# Patient Record
Sex: Female | Born: 1955 | Race: Black or African American | Hispanic: No | Marital: Single | Smoking: Never smoker
Health system: Southern US, Community
[De-identification: ages and names within clinical notes are randomized; demographics above are authoritative.]

## PROBLEM LIST (undated history)

## (undated) DIAGNOSIS — G4730 Sleep apnea, unspecified: Secondary | ICD-10-CM

## (undated) DIAGNOSIS — Z8489 Family history of other specified conditions: Secondary | ICD-10-CM

## (undated) DIAGNOSIS — J189 Pneumonia, unspecified organism: Secondary | ICD-10-CM

## (undated) DIAGNOSIS — R079 Chest pain, unspecified: Secondary | ICD-10-CM

## (undated) DIAGNOSIS — I10 Essential (primary) hypertension: Secondary | ICD-10-CM

## (undated) DIAGNOSIS — K219 Gastro-esophageal reflux disease without esophagitis: Secondary | ICD-10-CM

## (undated) DIAGNOSIS — E7800 Pure hypercholesterolemia, unspecified: Secondary | ICD-10-CM

## (undated) DIAGNOSIS — I2699 Other pulmonary embolism without acute cor pulmonale: Secondary | ICD-10-CM

## (undated) HISTORY — PX: COLONOSCOPY: SHX174

## (undated) HISTORY — PX: TONSILLECTOMY: SUR1361

## (undated) HISTORY — DX: Chest pain, unspecified: R07.9

## (undated) HISTORY — PX: ABDOMINAL HYSTERECTOMY: SHX81

---

## 2000-12-06 ENCOUNTER — Encounter: Admission: RE | Admit: 2000-12-06 | Discharge: 2000-12-06 | Payer: Self-pay | Admitting: Internal Medicine

## 2000-12-27 ENCOUNTER — Encounter: Payer: Self-pay | Admitting: Internal Medicine

## 2000-12-27 ENCOUNTER — Ambulatory Visit (HOSPITAL_COMMUNITY): Admission: RE | Admit: 2000-12-27 | Discharge: 2000-12-27 | Payer: Self-pay

## 2002-01-30 ENCOUNTER — Ambulatory Visit (HOSPITAL_COMMUNITY): Admission: RE | Admit: 2002-01-30 | Discharge: 2002-01-30 | Payer: Self-pay | Admitting: Obstetrics & Gynecology

## 2002-01-30 ENCOUNTER — Encounter: Payer: Self-pay | Admitting: Obstetrics & Gynecology

## 2003-03-11 ENCOUNTER — Ambulatory Visit (HOSPITAL_COMMUNITY): Admission: RE | Admit: 2003-03-11 | Discharge: 2003-03-11 | Payer: Self-pay | Admitting: Internal Medicine

## 2003-11-15 ENCOUNTER — Other Ambulatory Visit: Payer: Self-pay

## 2004-02-28 ENCOUNTER — Encounter: Admission: RE | Admit: 2004-02-28 | Discharge: 2004-02-28 | Payer: Self-pay | Admitting: Internal Medicine

## 2004-03-23 ENCOUNTER — Ambulatory Visit (HOSPITAL_COMMUNITY): Admission: RE | Admit: 2004-03-23 | Discharge: 2004-03-23 | Payer: Self-pay | Admitting: Internal Medicine

## 2004-06-04 ENCOUNTER — Ambulatory Visit (HOSPITAL_COMMUNITY): Admission: RE | Admit: 2004-06-04 | Discharge: 2004-06-04 | Payer: Self-pay | Admitting: Obstetrics & Gynecology

## 2004-07-23 ENCOUNTER — Inpatient Hospital Stay (HOSPITAL_COMMUNITY): Admission: RE | Admit: 2004-07-23 | Discharge: 2004-07-24 | Payer: Self-pay | Admitting: Obstetrics & Gynecology

## 2004-07-23 ENCOUNTER — Encounter (INDEPENDENT_AMBULATORY_CARE_PROVIDER_SITE_OTHER): Payer: Self-pay | Admitting: *Deleted

## 2005-04-13 ENCOUNTER — Ambulatory Visit (HOSPITAL_COMMUNITY): Admission: RE | Admit: 2005-04-13 | Discharge: 2005-04-13 | Payer: Self-pay | Admitting: Internal Medicine

## 2005-09-29 ENCOUNTER — Ambulatory Visit: Payer: Self-pay | Admitting: Family Medicine

## 2005-11-16 ENCOUNTER — Ambulatory Visit: Payer: Self-pay | Admitting: Family Medicine

## 2006-02-14 ENCOUNTER — Ambulatory Visit: Payer: Self-pay | Admitting: Family Medicine

## 2006-04-18 ENCOUNTER — Ambulatory Visit (HOSPITAL_COMMUNITY): Admission: RE | Admit: 2006-04-18 | Discharge: 2006-04-18 | Payer: Self-pay | Admitting: Obstetrics & Gynecology

## 2006-05-09 ENCOUNTER — Ambulatory Visit: Payer: Self-pay | Admitting: Family Medicine

## 2006-08-02 ENCOUNTER — Ambulatory Visit: Payer: Self-pay | Admitting: Family Medicine

## 2007-01-31 DIAGNOSIS — E785 Hyperlipidemia, unspecified: Secondary | ICD-10-CM | POA: Insufficient documentation

## 2007-01-31 DIAGNOSIS — E782 Mixed hyperlipidemia: Secondary | ICD-10-CM | POA: Insufficient documentation

## 2007-01-31 DIAGNOSIS — I10 Essential (primary) hypertension: Secondary | ICD-10-CM | POA: Insufficient documentation

## 2007-06-08 ENCOUNTER — Ambulatory Visit (HOSPITAL_COMMUNITY): Admission: RE | Admit: 2007-06-08 | Discharge: 2007-06-08 | Payer: Self-pay | Admitting: Obstetrics & Gynecology

## 2007-09-15 ENCOUNTER — Telehealth (INDEPENDENT_AMBULATORY_CARE_PROVIDER_SITE_OTHER): Payer: Self-pay | Admitting: Internal Medicine

## 2008-01-06 ENCOUNTER — Emergency Department (HOSPITAL_COMMUNITY): Admission: EM | Admit: 2008-01-06 | Discharge: 2008-01-06 | Payer: Self-pay | Admitting: Emergency Medicine

## 2008-06-11 ENCOUNTER — Ambulatory Visit (HOSPITAL_COMMUNITY): Admission: RE | Admit: 2008-06-11 | Discharge: 2008-06-11 | Payer: Self-pay | Admitting: Family Medicine

## 2008-06-27 ENCOUNTER — Other Ambulatory Visit: Admission: RE | Admit: 2008-06-27 | Discharge: 2008-06-27 | Payer: Self-pay | Admitting: Family Medicine

## 2009-07-03 ENCOUNTER — Ambulatory Visit (HOSPITAL_COMMUNITY): Admission: RE | Admit: 2009-07-03 | Discharge: 2009-07-03 | Payer: Self-pay | Admitting: Family Medicine

## 2009-07-09 ENCOUNTER — Other Ambulatory Visit: Admission: RE | Admit: 2009-07-09 | Discharge: 2009-07-09 | Payer: Self-pay | Admitting: Family Medicine

## 2010-05-02 ENCOUNTER — Encounter: Payer: Self-pay | Admitting: Obstetrics & Gynecology

## 2010-05-02 ENCOUNTER — Encounter: Payer: Self-pay | Admitting: Internal Medicine

## 2010-05-03 ENCOUNTER — Encounter: Payer: Self-pay | Admitting: Obstetrics & Gynecology

## 2010-05-26 ENCOUNTER — Other Ambulatory Visit (HOSPITAL_COMMUNITY): Payer: Self-pay | Admitting: Family Medicine

## 2010-05-26 DIAGNOSIS — Z1231 Encounter for screening mammogram for malignant neoplasm of breast: Secondary | ICD-10-CM

## 2010-07-06 ENCOUNTER — Ambulatory Visit (HOSPITAL_COMMUNITY)
Admission: RE | Admit: 2010-07-06 | Discharge: 2010-07-06 | Disposition: A | Discharge status: Home / Self Care | Payer: BC Managed Care – PPO | Source: Ambulatory Visit | Attending: Family Medicine | Admitting: Family Medicine

## 2010-07-06 DIAGNOSIS — Z1231 Encounter for screening mammogram for malignant neoplasm of breast: Secondary | ICD-10-CM | POA: Insufficient documentation

## 2010-07-21 ENCOUNTER — Other Ambulatory Visit: Payer: Self-pay | Admitting: Family Medicine

## 2010-07-21 ENCOUNTER — Other Ambulatory Visit (HOSPITAL_COMMUNITY)
Admission: RE | Admit: 2010-07-21 | Discharge: 2010-07-21 | Disposition: A | Discharge status: Home / Self Care | Payer: BC Managed Care – PPO | Source: Ambulatory Visit | Attending: Family Medicine | Admitting: Family Medicine

## 2010-07-21 DIAGNOSIS — Z124 Encounter for screening for malignant neoplasm of cervix: Secondary | ICD-10-CM | POA: Insufficient documentation

## 2010-08-28 NOTE — Op Note (Signed)
NAME:  Renee Bennett, Renee Bennett           ACCOUNT NO.:  [id number]   MEDICAL RECORD NO.:  [id number]          PATIENT TYPE:  INP   LOCATION:  9302                          FACILITY:  [facility]   PHYSICIAN:  Roseanna Rainbow, M.D.DATE OF BIRTH:  [birth year]   DATE OF PROCEDURE:  07/23/2004  DATE OF DISCHARGE:                                 OPERATIVE REPORT   PREOPERATIVE DIAGNOSES:  Uterine fibroids, menorrhagia refractory to  attempts at medical management, rule out endometrial polyp.   POSTOPERATIVE DIAGNOSES:  Uterine fibroids, menorrhagia refractory to  attempts at medical management, rule out endometrial polyp.   PROCEDURE:  Total vaginal hysterectomy with morcellation using a coring  technique.   SURGEON:  Roseanna Rainbow, M.D.   ASSISTANT:  Coral Ceo, M.D.   ANESTHESIA:  General endotracheal.   COMPLICATIONS:  None.   ESTIMATED BLOOD LOSS:  100 mL.   URINE OUTPUT:  100 mL clear urine at the end of the procedure.   FLUIDS:  As per anesthesiology.   FINDINGS:  Examination under anesthesia demonstrated a mildly enlarged  anteverted uterus approximately 12-14 weeks aggregate size. Intraoperatively  there were multiple myomas noted, normal tubes and ovaries.   DESCRIPTION OF PROCEDURE:  The risks, benefits, indications and alternatives  of the procedure were reviewed with the patient and informed consent was  obtained. The patient was taken to the operating room with an IV running.  The patient was placed in the dorsal lithotomy position and prepped and  draped in the usual sterile fashion. A weighted speculum was placed into the  vagina. The cervix was grasped with a Christella Hartigan tenaculum. The cervix was then  injected anteriorly with 1% lidocaine with 1:200,000 of epinephrine. The  anterior vaginal mucosa was then incised with the scalpel and the bladder  dissected off the pubovesical cervical fascia anteriorly with Metzenbaum  scissors. The anterior cul-de-sac was  entered sharply. The same procedure  was performed posteriorly and the posterior cul-de-sac entered sharply  without difficulty. At this point, parametrial clamps were then placed over  the uterosacral ligaments on either side. These were then transected and  suture ligated with #0 Vicryl. Hemostasis was assured. The cardinal  ligaments were then clamped on both sides, transected and suture ligated in  a similar fashion. The uterine arteries and broad ligaments were then  serially clamped with parametrial clamps, transected and suture ligated on  both sides. Excellent hemostasis was visualized. At this point, a coring  technique was then performed to morcellate the uterus. Both cornu were then  clamped with parametrial clamps, transected and the remainder of the uterus  delivered. These pedicles were both secured with free ligatures and suture  ligatures.  Adequate hemostasis was noted.   The vaginal cuff angles were closed with figure-of-eight sutures of #0  Vicryl on both sides. The peritoneum was closed with a pursestring suture of  #0 Vicryl. The remainder of the vaginal cuff was closed with figure-of-eight  sutures of #0 Vicryl in an interrupted fashion. All instruments were then  removed from the vagina. The patient was taken out of the dorsal lithotomy  position and awakened  from general anesthesia. The patient was taken to the  PACU in stable condition. Sponge, lap, needle and instrument counts were  correct x2.   PATHOLOGY:  Uterus and cervix.      LAJ/MEDQ  D:  07/23/2004  T:  07/23/2004  Job:  [id number]

## 2010-08-28 NOTE — H&P (Signed)
NAME:  Renee Bennett, Renee Bennett NO.:  [id number]   MEDICAL RECORD NO.:  [id number]            PATIENT TYPE:   LOCATION:                                 FACILITY:   PHYSICIAN:  Roseanna Rainbow, M.D. DATE OF BIRTH:   DATE OF ADMISSION:  DATE OF DISCHARGE:                                HISTORY & PHYSICAL   CHIEF COMPLAINT:  The patient is a 55 year old African-American female with  a small to moderately enlarged myomatous uterus with secondary menorrhagia  refractory to attempts at medical management who presents for a total  vaginal hysterectomy.   HISTORY OF PRESENT ILLNESS:  Please see the above.  Work-up to date has  included a pelvic ultrasound from November of 2005 that demonstrated a  uterus with a sagittal diameter of 13 cm with multiple myomas; a possible  submucous myoma versus a small polyp.  A sonohystogram demonstrated multiple  myomas, as well as a 2 cm sessile endometrial polyp in the fundus of the  endometrial cavity.  A hemoglobin from August of 2005 was 11.2.  A Pap smear  from November of 2005 was within normal limits.   PAST OBSTETRIC AND GYNECOLOGIC HISTORY:  She is status post a bilateral  tubal ligation.  She has been pregnant 2 times.  She has 2 living children.   PAST MEDICAL HISTORY:  1.  Remarkable for chronic hypertension.  2.  Hyperlipidemia.   FAMILY HISTORY:  Remarkable for heart disease, diabetes, and hypertension.   SOCIAL HISTORY:  She is married.  A nonsmoker.  Consumes caffeine.   ALLERGIES:  No known drug allergies.   REVIEW OF SYSTEMS:  Noncontributory.   PAST SURGICAL HISTORY:  See the above.   PHYSICAL EXAMINATION:  VITAL SIGNS:  Stable, afebrile.  Blood pressure  140/72.  GENERAL/CONSTITUTIONAL:  African-American female who appears greater than  stated age.  Moderately overweight body habitus.  In no acute distress.  LUNGS:  Clear to auscultation bilaterally.  HEART:  Regular rate and rhythm.  ABDOMEN:  Soft,  nontender, without masses.  PELVIC:  Normal EGBUS.  Speculum exam of the vagina is clean.  The cervix is  without lesions or discharge.  Bimanual exam - the uterus is anteverted,  nontender.  Normal mobility.  Mildly enlarged, approximately 12 weeks  aggregate size.  Adnexa - no masses, organomegaly, or local guarding.  EXTREMITIES:  No clubbing, cyanosis, or edema.  SKIN:  Without rash.   ASSESSMENT:  Leiomyomatous uterus, mildly enlarged.  Also rule out  endometrial polyp with menorrhagia, refractory to attempts at medical  management.   PLAN:  The planned procedure is a total vaginal hysterectomy.  I discussed  the risks, benefits, and alternatives of surgery at length.      LAJ/MEDQ  D:  07/22/2004  T:  07/22/2004  Job:  [id number]

## 2011-06-03 ENCOUNTER — Other Ambulatory Visit (HOSPITAL_COMMUNITY): Payer: Self-pay | Admitting: Family Medicine

## 2011-06-03 DIAGNOSIS — Z1231 Encounter for screening mammogram for malignant neoplasm of breast: Secondary | ICD-10-CM

## 2011-07-08 ENCOUNTER — Ambulatory Visit (HOSPITAL_COMMUNITY): Payer: BC Managed Care – PPO

## 2011-07-09 ENCOUNTER — Ambulatory Visit (HOSPITAL_COMMUNITY): Payer: BC Managed Care – PPO

## 2011-07-09 ENCOUNTER — Ambulatory Visit (HOSPITAL_COMMUNITY)
Admission: RE | Admit: 2011-07-09 | Discharge: 2011-07-09 | Disposition: A | Discharge status: Home / Self Care | Payer: BC Managed Care – PPO | Source: Ambulatory Visit | Attending: Family Medicine | Admitting: Family Medicine

## 2011-07-09 DIAGNOSIS — Z1231 Encounter for screening mammogram for malignant neoplasm of breast: Secondary | ICD-10-CM | POA: Insufficient documentation

## 2012-06-07 ENCOUNTER — Other Ambulatory Visit (HOSPITAL_COMMUNITY): Payer: Self-pay | Admitting: Family Medicine

## 2012-07-10 ENCOUNTER — Ambulatory Visit (HOSPITAL_COMMUNITY)
Admission: RE | Admit: 2012-07-10 | Discharge: 2012-07-10 | Disposition: A | Discharge status: Home / Self Care | Payer: BC Managed Care – PPO | Source: Ambulatory Visit | Attending: Family Medicine | Admitting: Family Medicine

## 2012-07-10 DIAGNOSIS — Z1231 Encounter for screening mammogram for malignant neoplasm of breast: Secondary | ICD-10-CM

## 2013-05-02 ENCOUNTER — Emergency Department (HOSPITAL_COMMUNITY): Payer: BC Managed Care – PPO

## 2013-05-02 ENCOUNTER — Observation Stay (HOSPITAL_COMMUNITY)
Admission: EM | Admit: 2013-05-02 | Discharge: 2013-05-03 | Disposition: A | Discharge status: Home / Self Care | Payer: BC Managed Care – PPO | Attending: Internal Medicine | Admitting: Internal Medicine

## 2013-05-02 ENCOUNTER — Encounter (HOSPITAL_COMMUNITY): Payer: Self-pay | Admitting: Emergency Medicine

## 2013-05-02 DIAGNOSIS — R079 Chest pain, unspecified: Secondary | ICD-10-CM | POA: Diagnosis present

## 2013-05-02 DIAGNOSIS — E782 Mixed hyperlipidemia: Secondary | ICD-10-CM | POA: Diagnosis present

## 2013-05-02 DIAGNOSIS — I309 Acute pericarditis, unspecified: Secondary | ICD-10-CM

## 2013-05-02 DIAGNOSIS — IMO0002 Reserved for concepts with insufficient information to code with codable children: Secondary | ICD-10-CM | POA: Insufficient documentation

## 2013-05-02 DIAGNOSIS — Z79899 Other long term (current) drug therapy: Secondary | ICD-10-CM | POA: Insufficient documentation

## 2013-05-02 DIAGNOSIS — R000 Tachycardia, unspecified: Secondary | ICD-10-CM | POA: Insufficient documentation

## 2013-05-02 DIAGNOSIS — E785 Hyperlipidemia, unspecified: Secondary | ICD-10-CM | POA: Diagnosis present

## 2013-05-02 DIAGNOSIS — I10 Essential (primary) hypertension: Secondary | ICD-10-CM | POA: Insufficient documentation

## 2013-05-02 DIAGNOSIS — R0602 Shortness of breath: Secondary | ICD-10-CM | POA: Insufficient documentation

## 2013-05-02 DIAGNOSIS — E7800 Pure hypercholesterolemia, unspecified: Secondary | ICD-10-CM | POA: Insufficient documentation

## 2013-05-02 DIAGNOSIS — R0789 Other chest pain: Principal | ICD-10-CM | POA: Insufficient documentation

## 2013-05-02 DIAGNOSIS — Z8709 Personal history of other diseases of the respiratory system: Secondary | ICD-10-CM | POA: Insufficient documentation

## 2013-05-02 DIAGNOSIS — E669 Obesity, unspecified: Secondary | ICD-10-CM | POA: Insufficient documentation

## 2013-05-02 HISTORY — DX: Essential (primary) hypertension: I10

## 2013-05-02 HISTORY — DX: Pure hypercholesterolemia, unspecified: E78.00

## 2013-05-02 LAB — BASIC METABOLIC PANEL
BUN: 12 mg/dL (ref 6–23)
CALCIUM: 9.5 mg/dL (ref 8.4–10.5)
CHLORIDE: 100 meq/L (ref 96–112)
CO2: 23 meq/L (ref 19–32)
Creatinine, Ser: 0.57 mg/dL (ref 0.50–1.10)
GFR calc non Af Amer: >90 mL/min (ref >90)
Glucose, Bld: 152 mg/dL — ABNORMAL HIGH (ref 70–99)
Potassium: 4.3 mEq/L (ref 3.7–5.3)
Sodium: 138 mEq/L (ref 137–147)

## 2013-05-02 LAB — PRO B NATRIURETIC PEPTIDE: PRO B NATRI PEPTIDE: 25.5 pg/mL (ref 0–125)

## 2013-05-02 LAB — CBC
HCT: 39.4 % (ref 36.0–46.0)
Hemoglobin: 13.2 g/dL (ref 12.0–15.0)
MCH: 27.8 pg (ref 26.0–34.0)
MCHC: 33.5 g/dL (ref 30.0–36.0)
MCV: 83.1 fL (ref 78.0–100.0)
Platelets: 231 10*3/uL (ref 150–400)
RBC: 4.74 MIL/uL (ref 3.87–5.11)
RDW: 15.1 % (ref 11.5–15.5)
WBC: 17 10*3/uL — ABNORMAL HIGH (ref 4.0–10.5)

## 2013-05-02 LAB — CBC WITH DIFFERENTIAL/PLATELET
BASOS ABS: 0 10*3/uL (ref 0.0–0.1)
Basophils Relative: 0 % (ref 0–1)
EOS ABS: 0 10*3/uL (ref 0.0–0.7)
Eosinophils Relative: 0 % (ref 0–5)
HCT: 41.3 % (ref 36.0–46.0)
Hemoglobin: 13.9 g/dL (ref 12.0–15.0)
LYMPHS ABS: 1.5 10*3/uL (ref 0.7–4.0)
Lymphocytes Relative: 9 % — ABNORMAL LOW (ref 12–46)
MCH: 27.9 pg (ref 26.0–34.0)
MCHC: 33.7 g/dL (ref 30.0–36.0)
MCV: 82.9 fL (ref 78.0–100.0)
MONO ABS: 1 10*3/uL (ref 0.1–1.0)
Monocytes Relative: 7 % (ref 3–12)
NEUTROS ABS: 12.9 10*3/uL — AB (ref 1.7–7.7)
NEUTROS PCT: 84 % — AB (ref 43–77)
Platelets: 245 10*3/uL (ref 150–400)
RBC: 4.98 MIL/uL (ref 3.87–5.11)
RDW: 15.2 % (ref 11.5–15.5)
WBC: 15.5 10*3/uL — AB (ref 4.0–10.5)

## 2013-05-02 LAB — TROPONIN I

## 2013-05-02 LAB — CREATININE, SERUM
CREATININE: 0.72 mg/dL (ref 0.50–1.10)
GFR calc Af Amer: >90 mL/min (ref >90)
GFR calc non Af Amer: >90 mL/min (ref >90)

## 2013-05-02 LAB — D-DIMER, QUANTITATIVE: D-Dimer, Quant: 0.32 ug/mL-FEU (ref 0.00–0.48)

## 2013-05-02 MED ORDER — ACETAMINOPHEN 325 MG PO TABS: 650.0000 mg | ORAL_TABLET | ORAL | Status: DC | PRN

## 2013-05-02 MED ORDER — GI COCKTAIL ~~LOC~~: 30.0000 mL | Freq: Four times a day (QID) | ORAL | Status: DC | PRN

## 2013-05-02 MED ORDER — NITROGLYCERIN 2 % TD OINT
1.0000 [in_us] | TOPICAL_OINTMENT | Freq: Once | TRANSDERMAL | Status: AC
  Administered 2013-05-02: 1 [in_us] via TOPICAL
  Filled 2013-05-02: qty 1

## 2013-05-02 MED ORDER — PANTOPRAZOLE SODIUM 40 MG PO TBEC
40.0000 mg | DELAYED_RELEASE_TABLET | Freq: Every day | ORAL | Status: DC
  Administered 2013-05-02 – 2013-05-03 (×2): 40 mg via ORAL
  Filled 2013-05-02 (×2): qty 1

## 2013-05-02 MED ORDER — MORPHINE SULFATE 4 MG/ML IJ SOLN
4.0000 mg | Freq: Once | INTRAMUSCULAR | Status: AC
  Administered 2013-05-02: 4 mg via INTRAVENOUS
  Filled 2013-05-02: qty 1

## 2013-05-02 MED ORDER — BENAZEPRIL-HYDROCHLOROTHIAZIDE 20-12.5 MG PO TABS: 1.0000 | ORAL_TABLET | Freq: Every day | ORAL | Status: DC

## 2013-05-02 MED ORDER — MULTIVITAL PO TABS: 1.0000 | ORAL_TABLET | Freq: Every day | ORAL | Status: DC

## 2013-05-02 MED ORDER — ALPRAZOLAM 0.25 MG PO TABS: 0.2500 mg | ORAL_TABLET | Freq: Two times a day (BID) | ORAL | Status: DC | PRN

## 2013-05-02 MED ORDER — GI COCKTAIL ~~LOC~~
30.0000 mL | Freq: Once | ORAL | Status: AC
  Administered 2013-05-02: 30 mL via ORAL
  Filled 2013-05-02: qty 30

## 2013-05-02 MED ORDER — SIMVASTATIN 40 MG PO TABS
40.0000 mg | ORAL_TABLET | Freq: Every day | ORAL | Status: DC
  Administered 2013-05-03: 40 mg via ORAL
  Filled 2013-05-02: qty 1

## 2013-05-02 MED ORDER — ASPIRIN 81 MG PO CHEW
324.0000 mg | CHEWABLE_TABLET | Freq: Once | ORAL | Status: AC
  Administered 2013-05-02: 324 mg via ORAL
  Filled 2013-05-02: qty 4

## 2013-05-02 MED ORDER — SIMETHICONE 80 MG PO CHEW
80.0000 mg | CHEWABLE_TABLET | Freq: Four times a day (QID) | ORAL | Status: DC | PRN
  Filled 2013-05-02: qty 1

## 2013-05-02 MED ORDER — ONDANSETRON HCL 4 MG/2ML IJ SOLN: 4.0000 mg | Freq: Four times a day (QID) | INTRAMUSCULAR | Status: DC | PRN

## 2013-05-02 MED ORDER — FLUTICASONE PROPIONATE 50 MCG/ACT NA SUSP
1.0000 | Freq: Two times a day (BID) | NASAL | Status: DC
  Administered 2013-05-02 – 2013-05-03 (×2): 1 via NASAL
  Filled 2013-05-02: qty 16

## 2013-05-02 MED ORDER — SODIUM CHLORIDE 0.9 % IV SOLN: INTRAVENOUS | Status: DC

## 2013-05-02 MED ORDER — BENAZEPRIL HCL 20 MG PO TABS
20.0000 mg | ORAL_TABLET | Freq: Every day | ORAL | Status: DC
  Administered 2013-05-03: 20 mg via ORAL
  Filled 2013-05-02: qty 1

## 2013-05-02 MED ORDER — HYDROCHLOROTHIAZIDE 12.5 MG PO CAPS
12.5000 mg | ORAL_CAPSULE | Freq: Every day | ORAL | Status: DC
  Administered 2013-05-03: 12.5 mg via ORAL
  Filled 2013-05-02: qty 1

## 2013-05-02 MED ORDER — AZELASTINE-FLUTICASONE 137-50 MCG/ACT NA SUSP: 1.0000 | Freq: Two times a day (BID) | NASAL | Status: DC

## 2013-05-02 MED ORDER — ASPIRIN EC 325 MG PO TBEC
325.0000 mg | DELAYED_RELEASE_TABLET | Freq: Every day | ORAL | Status: DC
  Administered 2013-05-03: 325 mg via ORAL
  Filled 2013-05-02: qty 1

## 2013-05-02 MED ORDER — ENOXAPARIN SODIUM 40 MG/0.4ML ~~LOC~~ SOLN
40.0000 mg | SUBCUTANEOUS | Status: DC
  Administered 2013-05-02: 40 mg via SUBCUTANEOUS
  Filled 2013-05-02 (×2): qty 0.4

## 2013-05-02 MED ORDER — MORPHINE SULFATE 2 MG/ML IJ SOLN: 2.0000 mg | INTRAMUSCULAR | Status: DC | PRN

## 2013-05-02 MED ORDER — ADULT MULTIVITAMIN W/MINERALS CH
1.0000 | ORAL_TABLET | Freq: Every day | ORAL | Status: DC
Start: 2013-05-02 — End: 2013-05-03
  Administered 2013-05-02 – 2013-05-03 (×2): 1 via ORAL
  Filled 2013-05-02 (×2): qty 1

## 2013-05-02 NOTE — ED Notes (Signed)
Patient states started having central chest pain (tightness) last night.   Patient points to her throat instead of chest when describing the "chest pain".   Patient states her throat hurts also.

## 2013-05-02 NOTE — ED Provider Notes (Signed)
CSN: [id number]     Arrival date & time 05/02/13  1127 History   First MD Initiated Contact with Patient 05/02/13 1151     Chief Complaint  Patient presents with  . Chest Pain  . Shortness of Breath   (Consider location/radiation/quality/duration/timing/severity/associated sxs/prior Treatment) HPI  This a 58 year old female with a history of hypertension, hyperlipidemia who presents for chest pain. Patient reports onset of symptoms last night. She reports the pain as a "tightness." It is worse with breathing. She denies any dyspnea on exertion. Currently her pain is rated at 2/10. She denies any leg swelling, history of blood clots, recent surgery, or wrist factors for clots. She does have early family history of heart disease. Patient has not taken aspirin today.  Past Medical History  Diagnosis Date  . Hypertension   . Hypercholesterolemia    Past Surgical History  Procedure Laterality Date  . Abdominal hysterectomy    . Tonsillectomy     No family history on file. History  Substance Use Topics  . Smoking status: Never Smoker   . Smokeless tobacco: Not on file  . Alcohol Use: No   OB History   Grav Para Term Preterm Abortions TAB SAB Ect Mult Living                 Review of Systems  Constitutional: Negative for fever.  Respiratory: Positive for chest tightness and shortness of breath. Negative for cough.   Cardiovascular: Positive for chest pain. Negative for leg swelling.  Gastrointestinal: Negative for nausea, vomiting and abdominal pain.  Genitourinary: Negative for dysuria.  Skin: Negative for rash.  Neurological: Negative for headaches.  Psychiatric/Behavioral: Negative for confusion.  All other systems reviewed and are negative.    Allergies  Review of patient's allergies indicates no known allergies.  Home Medications   Current Outpatient Rx  Name  Route  Sig  Dispense  Refill  . Azelastine-Fluticasone (DYMISTA) 137-50 MCG/ACT SUSP   Nasal   Place 1  spray into the nose 2 (two) times daily.         . benazepril-hydrochlorthiazide (LOTENSIN HCT) 20-12.5 MG per tablet   Oral   Take 1 tablet by mouth daily.         . Multiple Vitamins-Minerals (MULTIVITAL) tablet   Oral   Take 1 tablet by mouth daily.         . Simethicone (GAS-X PO)   Oral   Take 1 tablet by mouth daily.         . simvastatin (ZOCOR) 40 MG tablet   Oral   Take 40 mg by mouth daily.          BP 130/77  Pulse 112  Temp(Src) 98.8 F (37.1 C) (Oral)  Resp 30  Ht 5\' 6"  (1.676 m)  Wt 236 lb (107.049 kg)  BMI 38.11 kg/m2  SpO2 95% Physical Exam  Nursing note and vitals reviewed. Constitutional: She is oriented to person, place, and time. No distress.  Obese  HENT:  Head: Normocephalic and atraumatic.  Mouth/Throat: Oropharynx is clear and moist.  Eyes: Pupils are equal, round, and reactive to light.  Neck: Neck supple.  Cardiovascular: Normal rate, regular rhythm and normal heart sounds.   No murmur heard. Pulmonary/Chest: Effort normal and breath sounds normal. No respiratory distress. She has no wheezes.  Abdominal: Soft. Bowel sounds are normal.  Musculoskeletal: She exhibits no edema.  Neurological: She is alert and oriented to person, place, and time.  Skin: Skin is warm  and dry.  Psychiatric: She has a normal mood and affect.    ED Course  Procedures (including critical care time) Labs Review Labs Reviewed  CBC WITH DIFFERENTIAL - Abnormal; Notable for the following:    WBC 15.5 (*)    Neutrophils Relative % 84 (*)    Neutro Abs 12.9 (*)    Lymphocytes Relative 9 (*)    All other components within normal limits  BASIC METABOLIC PANEL - Abnormal; Notable for the following:    Glucose, Bld 152 (*)    All other components within normal limits  TROPONIN I  PRO B NATRIURETIC PEPTIDE  D-DIMER, QUANTITATIVE   Imaging Review Dg Chest 2 View  05/02/2013   CLINICAL DATA:  Chest pain and shortness of breath.  EXAM: CHEST  2 VIEW   COMPARISON:  None.  FINDINGS: Minimal linear atelectasis is seen in the left lung base. There is mild elevation of the right hemidiaphragm. Right lung is clear. Heart size is normal. No pneumothorax or pleural effusion.  IMPRESSION: No acute disease.   Electronically Signed   By: [name]  Dalessio M.D.   On: 05/02/2013 13:09    EKG Interpretation    Date/Time:  Wednesday May 02 2013 11:34:01 EST Ventricular Rate:  128 PR Interval:  148 QRS Duration: 86 QT Interval:  314 QTC Calculation: 458 R Axis:   31 Text Interpretation:  Sinus tachycardia Possible Left atrial enlargement Borderline ECG NO prior for comparison Confirmed by Rohini Jaroszewski  MD, Marchello Rothgeb ([id number]) on 05/02/2013 11:54:11 AM            MDM   1. Chest pain    Patient presents with chest pain. Currently 10 out of 10. Noted to be tachycardic on exam to 127. No history of blood clots or leg swelling. Primary is negative. Patient does have risk factors for ACS including hypertension, hyperlipidemia, and early family history. EKG notable for tachycardia. Chest x-ray and troponin are reassuring. Patient was given nitro paste and aspirin. She continues to report a "twinge of pain." On reexamination. Patient was given morphine and a GI cocktail. She had a recent history of sore throat and was told that she might have GERD. Heart score is 4. Given the pain and risk factors, will limit for serial cardiac enzymes.    Shon Batonourtney F Masiah Lewing, MD 05/02/13 [phone]

## 2013-05-02 NOTE — H&P (Signed)
PATIENT DETAILS Name: DARNESHIA DEMARY Age: 58 y.o. Sex: female Date of Birth: [birth year] Admit Date: 05/02/2013 PCP:Amy Ermalene Searing, MD   CHIEF COMPLAINT:  Chest pain since this morning  HPI: ARIZA EVANS is a 58 y.o. female with a Past Medical History of hypertension and dyslipidemia who presents today with the above noted complaint. Per patient, she woke up early this morning around 2:30 with retrosternal chest pain. She describes the chest pain as tightness, made worse by breathing. No associated nausea, vomiting or diaphoresis. Claims to have had associated shortness of breath. Earlier this morning, she rates her pain around 6/10 in severity. She claims that she feels at times the pain in her neck as well. She was unable to sleep because of the chest discomfort. She had a previously scheduled appointment with ENT, following the appointment because she was still having some mild persistent chest pain she came to the emergency room for further evaluation and treatment. In the emergency room, initial cardiac enzymes and troponins were negative. D-dimer was also within normal limits. I was subsequently asked to admit this patient for further evaluation and treatment.   ALLERGIES:  No Known Allergies  PAST MEDICAL HISTORY: Past Medical History  Diagnosis Date  . Hypertension   . Hypercholesterolemia     PAST SURGICAL HISTORY: Past Surgical History  Procedure Laterality Date  . Abdominal hysterectomy    . Tonsillectomy      MEDICATIONS AT HOME: Prior to Admission medications   Medication Sig Start Date End Date Taking? Authorizing Provider  Azelastine-Fluticasone (DYMISTA) 137-50 MCG/ACT SUSP Place 1 spray into the nose 2 (two) times daily.   Yes Historical Provider, MD  benazepril-hydrochlorthiazide (LOTENSIN HCT) 20-12.5 MG per tablet Take 1 tablet by mouth daily.   Yes Historical Provider, MD  Multiple Vitamins-Minerals (MULTIVITAL) tablet Take 1 tablet by mouth  daily.   Yes Historical Provider, MD  Simethicone (GAS-X PO) Take 1 tablet by mouth daily.   Yes Historical Provider, MD  simvastatin (ZOCOR) 40 MG tablet Take 40 mg by mouth daily.   Yes Historical Provider, MD    FAMILY HISTORY:  Coronary artery disease in sister at 66  SOCIAL HISTORY:  reports that she has never smoked. She does not have any smokeless tobacco history on file. She reports that she does not drink alcohol or use illicit drugs.  REVIEW OF SYSTEMS:  Constitutional:   No  weight loss, night sweats,  Fevers, chills, fatigue.  HEENT:    No headaches, Difficulty swallowing,Tooth/dental problems,Sore throat,  No sneezing, itching, ear ache, nasal congestion, post nasal drip,   Cardio-vascular: No chest pain,  Orthopnea, PND, swelling in lower extremities, anasarca,         dizziness, palpitations  GI:  No heartburn, indigestion, abdominal pain, nausea, vomiting, diarrhea, change in       bowel habits, loss of appetite  Resp: No shortness of breath with exertion or at rest.  No excess mucus, no productive cough, No non-productive cough,  No coughing up of blood.No change in color of mucus.No wheezing.No chest wall deformity  Skin:  no rash or lesions.  GU:  no dysuria, change in color of urine, no urgency or frequency.  No flank pain.  Musculoskeletal: No joint pain or swelling.  No decreased range of motion.  No back pain.  Psych: No change in mood or affect. No depression or anxiety.  No memory loss.   PHYSICAL EXAM: Blood pressure 100/57, pulse 99, temperature 98.8 F (37.1 C), temperature  source Oral, resp. rate 22, height 5\' 6"  (1.676 m), weight 107.049 kg (236 lb), SpO2 96.00%.  General appearance :Awake, alert, not in any distress. Speech Clear. Not toxic Looking HEENT: Atraumatic and Normocephalic, pupils equally reactive to light and accomodation Neck: supple, no JVD. No cervical lymphadenopathy.  Chest:Good air entry bilaterally, no added sounds   CVS: S1 S2 regular, no murmurs.  Abdomen: Bowel sounds present, Non tender and not distended with no gaurding, rigidity or rebound. Extremities: B/L Lower Ext shows no edema, both legs are warm to touch Neurology: Awake alert, and oriented X 3, CN II-XII intact, Non focal Skin:No Rash Wounds:N/A  LABS ON ADMISSION:   Recent Labs  05/02/13 1220  NA 138  K 4.3  CL 100  CO2 23  GLUCOSE 152*  BUN 12  CREATININE 0.57  CALCIUM 9.5   No results found for this basename: AST, ALT, ALKPHOS, BILITOT, PROT, ALBUMIN,  in the last 72 hours No results found for this basename: LIPASE, AMYLASE,  in the last 72 hours  Recent Labs  05/02/13 1220  WBC 15.5*  NEUTROABS 12.9*  HGB 13.9  HCT 41.3  MCV 82.9  PLT 245    Recent Labs  05/02/13 1220  TROPONINI <0.30    Recent Labs  05/02/13 1220  DDIMER 0.32   No components found with this basename: POCBNP,    RADIOLOGIC STUDIES ON ADMISSION: Dg Chest 2 View  05/02/2013   CLINICAL DATA:  Chest pain and shortness of breath.  EXAM: CHEST  2 VIEW  COMPARISON:  None.  FINDINGS: Minimal linear atelectasis is seen in the left lung base. There is mild elevation of the right hemidiaphragm. Right lung is clear. Heart size is normal. No pneumothorax or pleural effusion.  IMPRESSION: No acute disease.   Electronically Signed   By: [name]  Dalessio M.D.   On: 05/02/2013 13:09     EKG: Independently reviewed.  Normal sinus rhythm  ASSESSMENT AND PLAN: Present on Admission:  . Chest pain - With both typical and atypical features. Currently chest pain-free, initial cardiac enzymes, EKG and d-dimer negative.  - Continue aspirin, we'll admit as observation in telemetry and keep n.p.o. post midnight for possible stress testing. -Have notified cardiology of the patient's admission, and they will see him in a.m.  - Cycle cardiac enzymes, if troponins do turn positive we'll consult cardiology   . HYPERLIPIDEMIA - Continue statins   .  HYPERTENSION - Continue preadmission antihypertensive medications   Further plan will depend as patient's clinical course evolves and further radiologic and laboratory data become available. Patient will be monitored closely.  Above noted plan was discussed with patient/son, they were in agreement.   DVT Prophylaxis: Prophylactic Lovenox  Code Status: Full Code  Total time spent for admission equals 45 minutes.  [facility] Surgery Center At Clifton RoadGHIMIRE,Macey Wurtz [facility] Pager [phone]  If 7PM-7AM, please contact night-coverage [email or web address] Password [email or web address] 05/02/2013, 3:57 PM

## 2013-05-03 ENCOUNTER — Observation Stay (HOSPITAL_COMMUNITY): Payer: BC Managed Care – PPO

## 2013-05-03 DIAGNOSIS — I309 Acute pericarditis, unspecified: Secondary | ICD-10-CM

## 2013-05-03 DIAGNOSIS — R079 Chest pain, unspecified: Secondary | ICD-10-CM

## 2013-05-03 DIAGNOSIS — I517 Cardiomegaly: Secondary | ICD-10-CM

## 2013-05-03 LAB — URINALYSIS, ROUTINE W REFLEX MICROSCOPIC
Bilirubin Urine: NEGATIVE
GLUCOSE, UA: NEGATIVE mg/dL
Hgb urine dipstick: NEGATIVE
Ketones, ur: NEGATIVE mg/dL
Leukocytes, UA: NEGATIVE
Nitrite: NEGATIVE
PH: 5.5 (ref 5.0–8.0)
Protein, ur: NEGATIVE mg/dL
Specific Gravity, Urine: 1.02 (ref 1.005–1.030)
Urobilinogen, UA: 1 mg/dL (ref 0.0–1.0)

## 2013-05-03 MED ORDER — AMOXICILLIN-POT CLAVULANATE 875-125 MG PO TABS
1.0000 | ORAL_TABLET | Freq: Two times a day (BID) | ORAL | Status: DC
  Administered 2013-05-03: 1 via ORAL
  Filled 2013-05-03 (×2): qty 1

## 2013-05-03 MED ORDER — PANTOPRAZOLE SODIUM 40 MG PO TBEC: 40.0000 mg | DELAYED_RELEASE_TABLET | Freq: Every day | ORAL | Status: DC

## 2013-05-03 MED ORDER — TECHNETIUM TC 99M SESTAMIBI GENERIC - CARDIOLITE
30.0000 | Freq: Once | INTRAVENOUS | Status: AC | PRN
  Administered 2013-05-03: 30 via INTRAVENOUS

## 2013-05-03 MED ORDER — IBUPROFEN 400 MG PO TABS: 400.0000 mg | ORAL_TABLET | Freq: Three times a day (TID) | ORAL | Status: DC

## 2013-05-03 MED ORDER — PANTOPRAZOLE SODIUM 40 MG PO TBEC
40.0000 mg | DELAYED_RELEASE_TABLET | Freq: Every day | ORAL | Status: DC
  Administered 2013-05-03: 40 mg via ORAL

## 2013-05-03 MED ORDER — AMOXICILLIN-POT CLAVULANATE 875-125 MG PO TABS: 1.0000 | ORAL_TABLET | Freq: Two times a day (BID) | ORAL | Status: DC

## 2013-05-03 MED ORDER — IBUPROFEN 400 MG PO TABS
400.0000 mg | ORAL_TABLET | Freq: Three times a day (TID) | ORAL | Status: DC
  Administered 2013-05-03: 400 mg via ORAL
  Filled 2013-05-03 (×2): qty 1

## 2013-05-03 MED ORDER — FLUTICASONE PROPIONATE 50 MCG/ACT NA SUSP: 1.0000 | Freq: Two times a day (BID) | NASAL | Status: DC

## 2013-05-03 MED ORDER — TECHNETIUM TC 99M SESTAMIBI GENERIC - CARDIOLITE
10.0000 | Freq: Once | INTRAVENOUS | Status: AC | PRN
  Administered 2013-05-03: 10 via INTRAVENOUS

## 2013-05-03 NOTE — Progress Notes (Signed)
Stress myoview completed without complications.  + ST elevation inf. Lat leads on EKG and this did increase.  Only pain with deep breath.  Nuc results to follow.

## 2013-05-03 NOTE — Progress Notes (Signed)
Chart reviewed for chest pain unit. Some aspects are suggestive of pericarditis but no rub audible. Multiple risk factors for CAD. EKG shows ST elevation consistent with benign early repolarization vs pericarditis less likely. Will proceed with treadmill myoview. Will also get echo.

## 2013-05-03 NOTE — Progress Notes (Signed)
  Echocardiogram 2D Echocardiogram has been performed.  Renee Bennett, Renee Bennett 05/03/2013, 4:23 PM

## 2013-05-03 NOTE — Discharge Summary (Signed)
Physician Discharge Summary  Renee Bennett:[id number] DOB: February 23, 1956 DOA: 05/02/2013  PCP: [name], MD  Admit date: 05/02/2013 Discharge date: 05/03/2013  Time spent: 35 minutes  Recommendations for Outpatient Follow-up:  1. Follow up resolution of chest pain, pericarditis.   Discharge Diagnoses:    Pericarditis   Sinusitis   Chest pain   HYPERLIPIDEMIA   HYPERTENSION   Discharge Condition: stable.   Diet recommendation: Heart Healthy  Filed Weights   05/02/13 1131 05/03/13 0501  Weight: 107.049 kg (236 lb) 106.988 kg (235 lb 13.9 oz)    History of present illness:  Renee Bennett is a 58 y.o. female with a Past Medical History of hypertension and dyslipidemia who presents today with the above noted complaint. Per patient, she woke up early this morning around 2:30 with retrosternal chest pain. She describes the chest pain as tightness, made worse by breathing. No associated nausea, vomiting or diaphoresis. Claims to have had associated shortness of breath. Earlier this morning, she rates her pain around 6/10 in severity. She claims that she feels at times the pain in her neck as well. She was unable to sleep because of the chest discomfort. She had a previously scheduled appointment with ENT, following the appointment because she was still having some mild persistent chest pain she came to the emergency room for further evaluation and treatment. In the emergency room, initial cardiac enzymes and troponins were negative. D-dimer was also within normal limits. I was subsequently asked to admit this patient for further evaluation and treatment.   Hospital Course:  1-Pericarditis: patient presents with pleuritic chest pain, EKG with diffuse ST elevation. Suspect secondary to viral infection. No contraindication to NSAID. Will start Ibuprofen, protonix. She was evaluated by cardio.  She had Myoview which was negative for ischemia. ECHO negative for pericardial effusion,  or tamponade.   2-Sinusitis: nasal congestion, sinus pain. WBC elevated. Will start augmentin. Continue with fluticasone  3-HTN; continue with home BP medications.    Procedures: ECHO; Left ventricle: The cavity size was normal. Wall thickness was increased in a pattern of mild LVH. Systolic function was normal. The estimated ejection fraction was in the range of 60% to 65%. Wall motion was normal; there were no regional wall motion abnormalities. Left ventricular diastolic function parameters were normal.    Myoview: negative for ischemia  Consultations:  Cardiology  Discharge Exam: Filed Vitals:   05/03/13 1353  BP: 138/82  Pulse: 94  Temp: 98.5 F (36.9 C)  Resp: 20    General: No distress.  Cardiovascular: S 1, S 2 RRR Respiratory: CTA  Discharge Instructions  Discharge Orders   Future Orders Complete By Expires   Diet - low sodium heart healthy  As directed    Increase activity slowly  As directed        Medication List         amoxicillin-clavulanate 875-125 MG per tablet  Commonly known as:  AUGMENTIN  Take 1 tablet by mouth 2 (two) times daily with a meal.     benazepril-hydrochlorthiazide 20-12.5 MG per tablet  Commonly known as:  LOTENSIN HCT  Take 1 tablet by mouth daily.     DYMISTA 137-50 MCG/ACT Susp  Generic drug:  Azelastine-Fluticasone  Place 1 spray into the nose 2 (two) times daily.     fluticasone 50 MCG/ACT nasal spray  Commonly known as:  FLONASE  Place 1 spray into both nostrils 2 (two) times daily.     GAS-X PO  Take  1 tablet by mouth daily.     ibuprofen 400 MG tablet  Commonly known as:  ADVIL,MOTRIN  Take 1 tablet (400 mg total) by mouth 3 (three) times daily with meals.     MULTIVITAL tablet  Take 1 tablet by mouth daily.     pantoprazole 40 MG tablet  Commonly known as:  PROTONIX  Take 1 tablet (40 mg total) by mouth daily.     simvastatin 40 MG tablet  Commonly known as:  ZOCOR  Take 40 mg by mouth daily.        No Known Allergies     Follow-up Information   Follow up with Kerby NoraAmy Bedsole, MD In 1 week.   Specialty:  Family Medicine   Contact information:   [address] [place] [address] [phone]       Follow up with [name], MD. Call in 2 weeks.   Specialty:  Cardiology   Contact information:   [address] [place] [address] [phone]        The results of significant diagnostics from this hospitalization (including imaging, microbiology, ancillary and laboratory) are listed below for reference.    Significant Diagnostic Studies: Dg Chest 2 View  05/02/2013   CLINICAL DATA:  Chest pain and shortness of breath.  EXAM: CHEST  2 VIEW  COMPARISON:  None.  FINDINGS: Minimal linear atelectasis is seen in the left lung base. There is mild elevation of the right hemidiaphragm. Right lung is clear. Heart size is normal. No pneumothorax or pleural effusion.  IMPRESSION: No acute disease.   Electronically Signed   By: [name]  Dalessio M.D.   On: 05/02/2013 13:09   Nm Myocar Multi W/spect W/wall Motion / Ef  05/03/2013   CLINICAL DATA:  Chest pain and history of hypertension and hyperlipidemia.  EXAM: MYOCARDIAL IMAGING WITH SPECT (REST AND EXERCISE)  GATED LEFT VENTRICULAR WALL MOTION STUDY  LEFT VENTRICULAR EJECTION FRACTION  TECHNIQUE: Standard myocardial SPECT imaging was performed after resting intravenous injection of 10 mCi [id number] sestamibi. Subsequently, exercise tolerance test was performed by the patient under the supervision of the Cardiology staff. At peak-stress, 30 mCi [id number] sestamibi was injected intravenously and standard myocardial SPECT imaging was performed. Quantitative gated imaging was also performed to evaluate left ventricular wall motion, and estimate left ventricular ejection fraction.  COMPARISON:  None.  FINDINGS: Utilizing gated data, the end-diastolic volume is estimated to be 86 mL and the end  systolic volume 30 mL. Calculated ejection fraction is 65%.  Gated wall motion analysis is within normal limits.  The patient was exercised on a treadmill utilizing Bruce protocol. Maximal heart rate achieved was 148 beats per min which is 85% of predicted max for age. SPECT imaging shows no evidence of inducible myocardial ischemia. Some mild attenuation of the mid inferolateral wall was present on both stress and rest studies. No significant fixed perfusion defects are identified.  IMPRESSION: No evidence of inducible myocardial ischemia with treadmill exercise. Left ventricular function is normal with quantitative ejection fraction calculation of 65%.   Electronically Signed   By: Irish LackGlenn  Yamagata M.D.   On: 05/03/2013 13:17    Microbiology: No results found for this or any previous visit (from the past 240 hour(s)).   Labs: Basic Metabolic Panel:  Recent Labs Lab 05/02/13 1220 05/02/13 1728  NA 138  --   K 4.3  --   CL 100  --   CO2 23  --  GLUCOSE 152*  --   BUN 12  --   CREATININE 0.57 0.72  CALCIUM 9.5  --    Liver Function Tests: No results found for this basename: AST, ALT, ALKPHOS, BILITOT, PROT, ALBUMIN,  in the last 168 hours No results found for this basename: LIPASE, AMYLASE,  in the last 168 hours No results found for this basename: AMMONIA,  in the last 168 hours CBC:  Recent Labs Lab 05/02/13 1220 05/02/13 1728  WBC 15.5* 17.0*  NEUTROABS 12.9*  --   HGB 13.9 13.2  HCT 41.3 39.4  MCV 82.9 83.1  PLT 245 231   Cardiac Enzymes:  Recent Labs Lab 05/02/13 1220  TROPONINI <0.30   BNP: BNP (last 3 results)  Recent Labs  05/02/13 1220  PROBNP 25.5   CBG: No results found for this basename: GLUCAP,  in the last 168 hours     Signed:  Simra Fiebig  [facility] 05/03/2013, 5:04 PM

## 2013-05-03 NOTE — Progress Notes (Signed)
UR completed 

## 2013-05-03 NOTE — Progress Notes (Signed)
Nitropaste removed for stress test. 

## 2013-05-08 ENCOUNTER — Ambulatory Visit: Payer: Self-pay | Admitting: Otolaryngology

## 2013-05-11 ENCOUNTER — Ambulatory Visit (INDEPENDENT_AMBULATORY_CARE_PROVIDER_SITE_OTHER): Payer: BC Managed Care – PPO | Admitting: Family Medicine

## 2013-05-11 ENCOUNTER — Telehealth: Payer: Self-pay | Admitting: Family Medicine

## 2013-05-11 ENCOUNTER — Encounter: Payer: Self-pay | Admitting: Family Medicine

## 2013-05-11 VITALS — BP 110/70 | HR 83 | Temp 98.0°F | Ht 66.5 in | Wt 225.8 lb

## 2013-05-11 DIAGNOSIS — J329 Chronic sinusitis, unspecified: Secondary | ICD-10-CM

## 2013-05-11 DIAGNOSIS — I309 Acute pericarditis, unspecified: Secondary | ICD-10-CM

## 2013-05-11 DIAGNOSIS — Z1231 Encounter for screening mammogram for malignant neoplasm of breast: Secondary | ICD-10-CM

## 2013-05-11 DIAGNOSIS — I10 Essential (primary) hypertension: Secondary | ICD-10-CM

## 2013-05-11 NOTE — Progress Notes (Signed)
Pre-visit discussion using our clinic review tool. No additional management support is needed unless otherwise documented below in the visit note.  

## 2013-05-11 NOTE — Assessment & Plan Note (Signed)
Well controlled... Will follow up for recheck in 6 month (which will be after she returns for CPX with PCP)

## 2013-05-11 NOTE — Progress Notes (Signed)
Subjective:    Patient ID: Renee Bennett, female    DOB: 30-Sep-1955, 58 y.o.   MRN: [id number]  HPI  58 year old female presents for hospital follow up.  She previously saw Billie Bean... In last few years has seen Dr. Valentina Lucks. She plans on having CPX there in 07/2013 then coming here afterward.  Admit date: 05/02/2013  Discharge date: 05/03/2013  Recommendations for Outpatient Follow-up:  1. Follow up resolution of chest pain, pericarditis.  Discharge Diagnoses:  Pericarditis  Sinusitis  Chest pain  HYPERLIPIDEMIA  HYPERTENSION   Per patient, she woke up early morning of admission around 2:30 with retrosternal chest pain. She described the chest pain as tightness, made worse by breathing. No associated nausea, vomiting or diaphoresis. Claimed to have had associated shortness of breath. Earlier that morning, she rates her pain around 6/10 in severity. She claims that she felt at times the pain in her neck as well. She was unable to sleep because of the chest discomfort. She had a previously scheduled appointment with ENT, following the appointment because she was still having some mild persistent chest pain she came to the emergency room for further evaluation and treatment. In the emergency room, initial cardiac enzymes and troponins were negative. D-dimer was also within normal limits.  Hospital Course:  1-Pericarditis: patient presents with pleuritic chest pain, EKG with diffuse ST elevation. Suspect secondary to viral infection. No contraindication to NSAID. Will start Ibuprofen, protonix. She was evaluated by cardio. She had Myoview which was negative for ischemia. ECHO negative for pericardial effusion, or tamponade.  2-Sinusitis: nasal congestion, sinus pain. WBC elevated. Will start augmentin. Continue with fluticasone  3-HTN; continue with home BP medications.    Since hospital discharge she reports  Her chest pain has improved, she still has some left shoulder pain. Using  ibuprofen as needed.  No SOB. No palpitations. She has scheduled follow up with cardiology next week. Still with nasal congestion, occ blood in mucus. She has had no further pain in face or fever after completing antibiotics.    One episode of heartburn.... Forgot to take protonix.  Review of Systems  Constitutional: Negative for fever and fatigue.  HENT: Negative for ear pain.   Eyes: Negative for pain.  Respiratory: Negative for chest tightness and shortness of breath.   Cardiovascular: Negative for chest pain, palpitations and leg swelling.  Gastrointestinal: Negative for abdominal pain.  Genitourinary: Negative for dysuria.       Objective:   Physical Exam  Constitutional: Vital signs are normal. She appears well-developed and well-nourished. She is cooperative.  Non-toxic appearance. She does not appear ill. No distress.  HENT:  Head: Normocephalic.  Right Ear: Hearing, tympanic membrane, external ear and ear canal normal. Tympanic membrane is not erythematous, not retracted and not bulging.  Left Ear: Hearing, tympanic membrane, external ear and ear canal normal. Tympanic membrane is not erythematous, not retracted and not bulging.  Nose: Mucosal edema and rhinorrhea present. Right sinus exhibits no maxillary sinus tenderness and no frontal sinus tenderness. Left sinus exhibits no maxillary sinus tenderness and no frontal sinus tenderness.  Mouth/Throat: Uvula is midline, oropharynx is clear and moist and mucous membranes are normal.  Eyes: Conjunctivae, EOM and lids are normal. Pupils are equal, round, and reactive to light. Lids are everted and swept, no foreign bodies found.  Neck: Trachea normal and normal range of motion. Neck supple. Carotid bruit is not present. No mass and no thyromegaly present.  Cardiovascular: Normal rate,  regular rhythm, S1 normal, S2 normal, normal heart sounds, intact distal pulses and normal pulses.  Exam reveals no gallop and no friction rub.   No  murmur heard. Pulmonary/Chest: Effort normal and breath sounds normal. Not tachypneic. No respiratory distress. She has no decreased breath sounds. She has no wheezes. She has no rhonchi. She has no rales.  Abdominal: Soft. Normal appearance and bowel sounds are normal. There is no tenderness.  Neurological: She is alert.  Skin: Skin is warm, dry and intact. No rash noted.  Psychiatric: Her speech is normal and behavior is normal. Judgment and thought content normal. Her mood appears not anxious. Cognition and memory are normal. She does not exhibit a depressed mood.          Assessment & Plan:

## 2013-05-11 NOTE — Patient Instructions (Signed)
Can start back on flonase nasal steroid 2 sprays per nostril daily. Keep appt with cardiology. Call if increasing chest pain, fever or any shortness of breath.

## 2013-05-11 NOTE — Assessment & Plan Note (Signed)
Resolving. Treat symptoms with nasal steroid spray.

## 2013-05-11 NOTE — Assessment & Plan Note (Signed)
Improving on ibuprofen. HAs follow up with cardiology scheduled.

## 2013-05-11 NOTE — Telephone Encounter (Signed)
Message copied by Excell SeltzerBEDSOLE, AMY E on Fri May 11, 2013  5:11 PM ------      Message from: [place], [place]      Created: Fri May 11, 2013  9:04 AM      Regarding: MM needed       At checkout, pt mentioned that she will be due for her screening Mammogram next month.  [facility], please place the order and we will schedule.  Thank you.  Best number for [phone].   ------

## 2013-05-18 ENCOUNTER — Ambulatory Visit (INDEPENDENT_AMBULATORY_CARE_PROVIDER_SITE_OTHER): Payer: BC Managed Care – PPO | Admitting: Cardiology

## 2013-05-18 ENCOUNTER — Encounter: Payer: Self-pay | Admitting: Cardiology

## 2013-05-18 ENCOUNTER — Encounter: Payer: Self-pay | Admitting: General Surgery

## 2013-05-18 VITALS — BP 148/92 | HR 100 | Ht 66.0 in | Wt 225.0 lb

## 2013-05-18 DIAGNOSIS — I309 Acute pericarditis, unspecified: Secondary | ICD-10-CM

## 2013-05-18 NOTE — Patient Instructions (Signed)
Your physician recommends that you continue on your current medications as directed. Please refer to the Current Medication list given to you today.  Your physician recommends that you schedule a follow-up appointment PRN

## 2013-05-18 NOTE — Progress Notes (Signed)
Patient ID: Renee Bennett, female   DOB: 1955/07/20, 58 y.o.   MRN: [id number]     Patient Name: Renee Bennett Date of Encounter: 05/18/2013  Primary Care Provider:  [name], MD Primary Cardiologist:  Tobias Alexander, H  Problem List   Past Medical History  Diagnosis Date  . Hypertension   . Hypercholesterolemia    Past Surgical History  Procedure Laterality Date  . Tonsillectomy    . Abdominal hysterectomy      partial    Allergies  No Known Allergies  HPI  A very pleasant 58 y.o. female with a Past Medical History of hypertension and dyslipidemia who presented to the ED on 05/03/2012 with retrosternal chest pain. She describes the chest pain as tightness, made worse by breathing. No associated nausea, vomiting or diaphoresis.  She was found to have significant diffuse PR depression and ST elevation suspicious of pericarditis. She was ruled out for ACS. She underwent an exercise nuclear stress test that showed no prior scar, no ischemia and preserved LVEF. Echocardiogram showed no pericardial effusion and she had no rub on physical exam. She was started on high-dose ibuprofen and discharged home.  Patient is coming today after 2 weeks, she has been using ibuprofen on and off. She states that her chest pain has resolved but she experiences shoulder pain for which she uses ibuprofen. She denies any other symptoms such as shortness of breath, orthopnea, PND, or lower extremity edema.  Home Medications  Prior to Admission medications   Medication Sig Start Date End Date Taking? Authorizing Provider  benazepril-hydrochlorthiazide (LOTENSIN HCT) 20-12.5 MG per tablet Take 1 tablet by mouth daily.    Historical Provider, MD  fluticasone (FLONASE) 50 MCG/ACT nasal spray Place 1 spray into both nostrils 2 (two) times daily. 05/03/13   Belkys A Regalado, MD  ibuprofen (ADVIL,MOTRIN) 400 MG tablet Take 1 tablet (400 mg total) by mouth 3 (three) times daily with meals.  05/03/13   Belkys A Regalado, MD  pantoprazole (PROTONIX) 40 MG tablet Take 1 tablet (40 mg total) by mouth daily. 05/03/13   Belkys A Regalado, MD  simvastatin (ZOCOR) 40 MG tablet Take 40 mg by mouth daily.    Historical Provider, MD    Family History  Family History  Problem Relation Age of Onset  . Heart disease Mother   . Hypertension Father   . Hyperlipidemia Father   . Diabetes Father   . Hypertension Sister   . Diabetes Maternal Grandmother   . Heart disease Paternal Grandmother   . Heart disease Paternal Grandfather   . Hypertension Sister     Social History  History   Social History  . Marital Status: Single    Spouse Name: N/A    Number of Children: N/A  . Years of Education: N/A   Occupational History  . Not on file.   Social History Main Topics  . Smoking status: Never Smoker   . Smokeless tobacco: Never Used  . Alcohol Use: No  . Drug Use: No  . Sexual Activity: Not on file   Other Topics Concern  . Not on file   Social History Narrative  . No narrative on file     Review of Systems, as per HPI, otherwise negative General:  No chills, fever, night sweats or weight changes.  Cardiovascular:  No chest pain, dyspnea on exertion, edema, orthopnea, palpitations, paroxysmal nocturnal dyspnea. Dermatological: No rash, lesions/masses Respiratory: No cough, dyspnea Urologic: No hematuria, dysuria Abdominal:  No nausea, vomiting, diarrhea, bright red blood per rectum, melena, or hematemesis Neurologic:  No visual changes, wkns, changes in mental status. All other systems reviewed and are otherwise negative except as noted above.  Physical Exam  Blood pressure 148/92, pulse 100, height 5\' 6"  (1.676 m), weight 225 lb (102.059 kg).  General: Pleasant, NAD Psych: Normal affect. Neuro: Alert and oriented X 3. Moves all extremities spontaneously. HEENT: Normal  Neck: Supple without bruits or JVD. Lungs:  Resp regular and unlabored, CTA. Heart: RRR no s3,  s4, or murmurs. No rub. Abdomen: Soft, non-tender, non-distended, BS + x 4.  Extremities: No clubbing, cyanosis or edema. DP/PT/Radials 2+ and equal bilaterally.  Labs:  No results found for this basename: CKTOTAL, CKMB, TROPONINI,  in the last 72 hours Lab Results  Component Value Date   WBC 17.0* 05/02/2013   HGB 13.2 05/02/2013   HCT 39.4 05/02/2013   MCV 83.1 05/02/2013   PLT 231 05/02/2013   No results found for this basename: NA, K, CL, CO2, BUN, CREATININE, CALCIUM, LABALBU, PROT, BILITOT, ALKPHOS, ALT, AST, GLUCOSE,  in the last 168 hours No results found for this basename: CHOL, HDL, LDLCALC, TRIG   Lab Results  Component Value Date   DDIMER 0.32 05/02/2013   No components found with this basename: POCBNP,   Accessory Clinical Findings  Echocardiogram on 05/03/2013  Study Conclusions  - Left ventricle: The cavity size was normal. Wall thickness was increased in a pattern of mild LVH. Systolic function was normal. The estimated ejection fraction was in the range of 60% to 65%. Wall motion was normal; there were no regional wall motion abnormalities. Left ventricular diastolic function parameters were normal. - Left atrium: The atrium was mildly dilated.  ECG - on 05/03/2013, sinus rhythm with diffuse ST elevation, depressed PR segment highly suspicious of pericarditis. Today normal sinus rhythm, with diffuse V. negative T waves. When compared to the prior EKG on 05/03/2013 ST elevations have resolved   Exercise nuclear stress test on 05/03/2013  IMPRESSION: No evidence of inducible myocardial ischemia with treadmill exercise. Left ventricular function is normal with quantitative ejection fraction calculation of 65%.  Assessment & Plan  A very pleasant 58 year old female who was diagnosed with episode of acute pericarditis after an episode of viral infection and sinusitis. Patient responded well to trial of NSAIDs. She had negative stress test, her ST elevation had  resolved and she symptomatically feeling much better. She is advised to continue taking ibuprofen for her symptoms and return to our clinic as needed. 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    Tobias AlexanderNELSON, Keshara Kiger, Rexene EdisonH, MD, [facility] 05/18/2013, 4:05 PM

## 2013-06-16 ENCOUNTER — Emergency Department (HOSPITAL_COMMUNITY): Payer: BC Managed Care – PPO

## 2013-06-16 ENCOUNTER — Encounter (HOSPITAL_COMMUNITY): Payer: Self-pay | Admitting: Emergency Medicine

## 2013-06-16 ENCOUNTER — Emergency Department (HOSPITAL_COMMUNITY)
Admission: EM | Admit: 2013-06-16 | Discharge: 2013-06-16 | Disposition: A | Discharge status: Home / Self Care | Payer: BC Managed Care – PPO | Attending: Emergency Medicine | Admitting: Emergency Medicine

## 2013-06-16 DIAGNOSIS — R0989 Other specified symptoms and signs involving the circulatory and respiratory systems: Secondary | ICD-10-CM | POA: Insufficient documentation

## 2013-06-16 DIAGNOSIS — Y95 Nosocomial condition: Secondary | ICD-10-CM

## 2013-06-16 DIAGNOSIS — I309 Acute pericarditis, unspecified: Secondary | ICD-10-CM | POA: Insufficient documentation

## 2013-06-16 DIAGNOSIS — J189 Pneumonia, unspecified organism: Secondary | ICD-10-CM | POA: Insufficient documentation

## 2013-06-16 DIAGNOSIS — I319 Disease of pericardium, unspecified: Secondary | ICD-10-CM

## 2013-06-16 DIAGNOSIS — I10 Essential (primary) hypertension: Secondary | ICD-10-CM | POA: Insufficient documentation

## 2013-06-16 DIAGNOSIS — Z79899 Other long term (current) drug therapy: Secondary | ICD-10-CM | POA: Insufficient documentation

## 2013-06-16 DIAGNOSIS — E7800 Pure hypercholesterolemia, unspecified: Secondary | ICD-10-CM | POA: Insufficient documentation

## 2013-06-16 MED ORDER — LEVOFLOXACIN 750 MG PO TABS: 750.0000 mg | ORAL_TABLET | Freq: Every day | ORAL | Status: DC

## 2013-06-16 MED ORDER — IBUPROFEN 600 MG PO TABS: 600.0000 mg | ORAL_TABLET | Freq: Three times a day (TID) | ORAL | Status: DC

## 2013-06-16 MED ORDER — ACETAMINOPHEN 325 MG PO TABS
650.0000 mg | ORAL_TABLET | Freq: Once | ORAL | Status: AC
  Administered 2013-06-16: 650 mg via ORAL
  Filled 2013-06-16: qty 2

## 2013-06-16 NOTE — ED Provider Notes (Signed)
CSN: [id number]     Arrival date & time 06/16/13  1549 History   First MD Initiated Contact with Patient 06/16/13 1619     Chief Complaint  Patient presents with  . Chest Pain     (Consider location/radiation/quality/duration/timing/severity/associated sxs/prior Treatment) HPI 58 yo female with a recent hx of acute pericarditis that was diagnosed on 05/02/13. Patient returns to ED today complaining of similar symptoms that she had at the time she was dx with Acute pericarditis. Patient admits to chest pain that is constant and squeezing in nature. Patient points to her sternum saying it hurts right in the center. Patient states pain is rated at 5/10 currently and worsened with deep inspiration, laying flat on your back, and when she turns on her side. Pain improved with sitting forward. Patient admits to occasional episodes of SOB that occurs at rest she states secondary to pain in chest. Patient admits to intermittent cough since was diagnosed dx with acute pericarditis. Patient admits to new onset of sputum production this morning with cough. Patient states sputum had streaks of blood in it today x 1 episode. Denies fever/chills, N/V, abdominal pain, HA, or any urinary sxs. Denies bloody stools.   Patient seen by her PCP 4 days ago. Patient had blood work at that time that was reported to be normal and had her Nexium increased.   Advanced age > 56 yo: Yes HTN: Yes Hyperlipidemia: Yes Cigarette smoking: No Diabetes Mellitus: No Family hx of CAD or MI < 56 yo : Yes Female or Post menopausal: Yes Cocaine use: No Prior MI: No CABG: No Stress test: Yes, on 05/03/13 and noted to be normal    Angina: No Cardiac cath: No Aspirin: None in the past 7 days.      Past Medical History  Diagnosis Date  . Hypertension   . Hypercholesterolemia    Past Surgical History  Procedure Laterality Date  . Tonsillectomy    . Abdominal hysterectomy      partial   Family History  Problem Relation Age  of Onset  . Heart disease Mother   . Hypertension Father   . Hyperlipidemia Father   . Diabetes Father   . Hypertension Sister   . Diabetes Maternal Grandmother   . Heart disease Paternal Grandmother   . Heart disease Paternal Grandfather   . Hypertension Sister    History  Substance Use Topics  . Smoking status: Never Smoker   . Smokeless tobacco: Never Used  . Alcohol Use: No   OB History   Grav Para Term Preterm Abortions TAB SAB Ect Mult Living                 Review of Systems  All other systems reviewed and are negative.      Allergies  Review of patient's allergies indicates no known allergies.  Home Medications   Current Outpatient Rx  Name  Route  Sig  Dispense  Refill  . benazepril-hydrochlorthiazide (LOTENSIN HCT) 20-12.5 MG per tablet   Oral   Take 1 tablet by mouth daily.         . calcium carbonate (TUMS - DOSED IN MG ELEMENTAL CALCIUM) 500 MG chewable tablet   Oral   Chew 2 tablets by mouth daily as needed for indigestion or heartburn.         . esomeprazole (NEXIUM 24HR) 20 MG capsule   Oral   Take 20 mg by mouth 2 (two) times daily before a meal.         .  simvastatin (ZOCOR) 40 MG tablet   Oral   Take 40 mg by mouth daily.         Marland Kitchen ibuprofen (ADVIL,MOTRIN) 600 MG tablet   Oral   Take 1 tablet (600 mg total) by mouth every 8 (eight) hours.   30 tablet   0   . levofloxacin (LEVAQUIN) 750 MG tablet   Oral   Take 1 tablet (750 mg total) by mouth daily. X 10 days   10 tablet   0    BP 148/84  Pulse 111  Temp(Src) 101.4 F (38.6 C) (Oral)  Resp 18  Ht 5\' 6"  (1.676 m)  Wt 210 lb 4.8 oz (95.391 kg)  BMI 33.96 kg/m2  SpO2 96% Physical Exam  Nursing note and vitals reviewed. Constitutional: She is oriented to person, place, and time. She appears well-developed and well-nourished. No distress.  HENT:  Head: Normocephalic and atraumatic.  Right Ear: Tympanic membrane and ear canal normal.  Left Ear: Tympanic membrane and  ear canal normal.  Nose: Nose normal. Right sinus exhibits no maxillary sinus tenderness and no frontal sinus tenderness. Left sinus exhibits no maxillary sinus tenderness and no frontal sinus tenderness.  Mouth/Throat: Uvula is midline, oropharynx is clear and moist and mucous membranes are normal. No oropharyngeal exudate, posterior oropharyngeal edema or posterior oropharyngeal erythema.  Eyes: Conjunctivae and EOM are normal. Right eye exhibits no discharge. Left eye exhibits no discharge. No scleral icterus.  Neck: Normal range of motion and phonation normal. Neck supple. No JVD present. No rigidity. No tracheal deviation, no edema and no erythema present.  Cardiovascular: Normal rate and regular rhythm.  Exam reveals no gallop and no friction rub.   No murmur heard. Pulmonary/Chest: Effort normal. No stridor. Not tachypneic. No respiratory distress. She has no wheezes. She has no rhonchi. She has rales.  Abdominal: Soft. She exhibits no distension. There is no tenderness.  Musculoskeletal: Normal range of motion. She exhibits no edema.  Lymphadenopathy:    She has no cervical adenopathy.  Neurological: She is alert and oriented to person, place, and time.  Skin: Skin is warm and dry. She is not diaphoretic.  Psychiatric: She has a normal mood and affect. Her behavior is normal.    ED Course  Procedures (including critical care time) Labs Review Labs Reviewed - No data to display Imaging Review Dg Chest 2 View  06/16/2013   CLINICAL DATA:  Midsternal chest pain  EXAM: CHEST  2 VIEW  COMPARISON:  DG CHEST 2 VIEW dated 05/02/2013  FINDINGS: The left hemidiaphragm remains elevated. There is new increased density above the hemidiaphragm consistent with atelectasis or pneumonia. On the right new increased density just above the hemidiaphragm is present as well but is less conspicuous than that on the left. The cardiac silhouette is enlarged. The pulmonary vascularity is not engorged. The  mediastinum is normal in width. The observed portions of the bony thorax exhibit no acute abnormalities.  IMPRESSION: Since the previous study there developed areas of atelectasis or pneumonia at both lung bases greater on the left than on the right. No pulmonary vascular congestion is demonstrated.   Electronically Signed   By: David  [place]   On: 06/16/2013 16:53     EKG Interpretation   Date/Time:  Saturday June 16 2013 15:54:24 EST Ventricular Rate:  112 PR Interval:  154 QRS Duration: 82 QT Interval:  302 QTC Calculation: 412 R Axis:   29 Text Interpretation:  Sinus tachycardia Possible Left atrial enlargement  Cannot rule out Anterior infarct , age undetermined Abnormal ECG improved  from prior Confirmed by ALLEN  MD, ANTHONY ([id number]) on 06/16/2013 5:01:24 PM      MDM   Final diagnoses:  HAP (hospital-acquired pneumonia)  Pericarditis   Patient tachycardic and febrile to 101.4 degrees F. Mild tachycardia consistent with fever  CXR and exam consistent with pneumonia. Patient appears stable for outpatient tx. Plan to treat patient for HAP due to recent hospitalization on 05/02/13. No widened mediastinum on CXR, doubt dissection.  Patient had recent stress test that was normal on 05/03/13 and pain is not angina in nature. Pain unchanged since diagnosed with Acute pericarditis on 05/02/13 and patient negative for MI at that time. Doubt MI or ACS.  Patient hx and chest pain consistent with Pericarditis. Patient restarted on Ibuprofen because patient stopped because was told by PCP to stop using due to exacerbating Heartburn sxs.  Patient advised to follow up with PCP in 2 days for reevaluation and to return to ED should she develop any worsening Chest pain or shortness of breath.   Patient discussed with Dr. Lorre Nick .   Meds given in ED:  Medications - No data to display  New Prescriptions   IBUPROFEN (ADVIL,MOTRIN) 600 MG TABLET    Take 1 tablet (600 mg total) by mouth every  8 (eight) hours.   LEVOFLOXACIN (LEVAQUIN) 750 MG TABLET    Take 1 tablet (750 mg total) by mouth daily. X 10 days       Rudene Anda, [place] 06/16/13 1918

## 2013-06-16 NOTE — ED Provider Notes (Signed)
Medical screening examination/treatment/procedure(s) were conducted as a shared visit with non-physician practitioner(s) and myself.  I personally evaluated the patient during the encounter.   EKG Interpretation   Date/Time:  Saturday June 16 2013 15:54:24 EST Ventricular Rate:  112 PR Interval:  154 QRS Duration: 82 QT Interval:  302 QTC Calculation: 412 R Axis:   29 Text Interpretation:  Sinus tachycardia Possible Left atrial enlargement  Cannot rule out Anterior infarct , age undetermined Abnormal ECG improved  from prior Confirmed by Sharnell Knight  MD, Phoebe Marter ([id number]) on 06/16/2013 5:01:24 PM     Patient seen and examined. Chest x-ray consistent with pneumonia. No evidence or concern for ACS. She is stable for discharge. Her EKG is improved from prior study  Toy BakerAnthony T Shahram Alexopoulos, MD 06/16/13 1731

## 2013-06-16 NOTE — ED Notes (Signed)
Pt c/o chest pain and indigestion since 1/21, shes been to her doctor and was admitted here for same with no diagnosis. Over past few days shes developed a cough and noticed some blood in it this morning. She denies fevers. She said shes lost weight also/

## 2013-06-16 NOTE — Discharge Instructions (Signed)
Follow up with your doctor in 2 days for reevaluation. Take Levaquin for Pneumonia once daily for 10 days. Take Ibuprofen every 8 hours for 10 days to treat pericarditis. Return to Emergency department if you develop any worsening chest pain, shortness of breath or fever/chills.    Pneumonia, Adult Pneumonia is an infection of the lungs. It may be caused by a germ (virus or bacteria). Some types of pneumonia can spread easily from person to person. This can happen when you cough or sneeze. HOME CARE  Only take medicine as told by your doctor.  Take your medicine (antibiotics) as told. Finish it even if you start to feel better.  Do not smoke.  You may use a vaporizer or humidifier in your room. This can help loosen thick spit (mucus).  Sleep so you are almost sitting up (semi-upright). This helps reduce coughing.  Rest. A shot (vaccine) can help prevent pneumonia. Shots are often advised for:  People over 56 years old.  Patients on chemotherapy.  People with long-term (chronic) lung problems.  People with immune system problems. GET HELP RIGHT AWAY IF:   You are getting worse.  You cannot control your cough, and you are losing sleep.  You cough up blood.  Your pain gets worse, even with medicine.  You have a fever.  Any of your problems are getting worse, not better.  You have shortness of breath or chest pain. MAKE SURE YOU:   Understand these instructions.  Will watch your condition.  Will get help right away if you are not doing well or get worse. Document Released: 09/15/2007 Document Revised: 06/21/2011 Document Reviewed: 06/19/2010 [facility] Patient Information 2014 [employer] [place], [place].

## 2013-06-17 NOTE — ED Provider Notes (Signed)
Medical screening examination/treatment/procedure(s) were conducted as a shared visit with non-physician practitioner(s) and myself.  I personally evaluated the patient during the encounter.   EKG Interpretation   Date/Time:  Saturday June 16 2013 15:54:24 EST Ventricular Rate:  112 PR Interval:  154 QRS Duration: 82 QT Interval:  302 QTC Calculation: 412 R Axis:   29 Text Interpretation:  Sinus tachycardia Possible Left atrial enlargement  Cannot rule out Anterior infarct , age undetermined Abnormal ECG improved  from prior Confirmed by Freida BusmanALLEN  MD, Starr Urias ([id number]) on 06/16/2013 5:01:24 PM       Toy BakerAnthony T Stephaney Steven, MD 06/17/13 [phone]

## 2013-07-02 ENCOUNTER — Ambulatory Visit
Admission: RE | Admit: 2013-07-02 | Discharge: 2013-07-02 | Disposition: A | Discharge status: Home / Self Care | Payer: BC Managed Care – PPO | Source: Ambulatory Visit | Attending: Family Medicine | Admitting: Family Medicine

## 2013-07-02 ENCOUNTER — Other Ambulatory Visit: Payer: Self-pay | Admitting: Family Medicine

## 2013-07-02 DIAGNOSIS — J189 Pneumonia, unspecified organism: Secondary | ICD-10-CM

## 2013-07-11 ENCOUNTER — Ambulatory Visit (HOSPITAL_COMMUNITY)
Admission: RE | Admit: 2013-07-11 | Discharge: 2013-07-11 | Disposition: A | Discharge status: Home / Self Care | Payer: BC Managed Care – PPO | Source: Ambulatory Visit | Attending: Family Medicine | Admitting: Family Medicine

## 2013-07-11 DIAGNOSIS — Z1231 Encounter for screening mammogram for malignant neoplasm of breast: Secondary | ICD-10-CM | POA: Insufficient documentation

## 2013-10-01 ENCOUNTER — Other Ambulatory Visit: Payer: Self-pay | Admitting: Gastroenterology

## 2013-11-05 ENCOUNTER — Telehealth: Payer: Self-pay | Admitting: Family Medicine

## 2013-11-05 DIAGNOSIS — E785 Hyperlipidemia, unspecified: Secondary | ICD-10-CM

## 2013-11-05 NOTE — Telephone Encounter (Signed)
Message copied by Excell SeltzerBEDSOLE, Shan Padgett E on Mon Nov 05, 2013 10:36 PM ------      Message from: Alvina ChouWALSH, TERRI J      Created: Wed Oct 31, 2013  3:22 PM      Regarding: Lab orders for Tuesday, 7.28.15       Patient is scheduled for CPX labs, please order future labs, Thanks , Terri       ------

## 2013-11-06 ENCOUNTER — Other Ambulatory Visit: Payer: BC Managed Care – PPO

## 2013-11-09 ENCOUNTER — Encounter: Payer: BC Managed Care – PPO | Admitting: Family Medicine

## 2013-11-16 ENCOUNTER — Encounter: Payer: BC Managed Care – PPO | Admitting: Family Medicine

## 2014-04-13 IMAGING — CR DG CHEST 2V
2 series · 2 of 2 positions shown · non-contrast
Comparison: DG CHEST 2 VIEW dated 05/02/2013

CLINICAL DATA: Midsternal chest pain

EXAM:
CHEST  2 VIEW

[w chest pa]
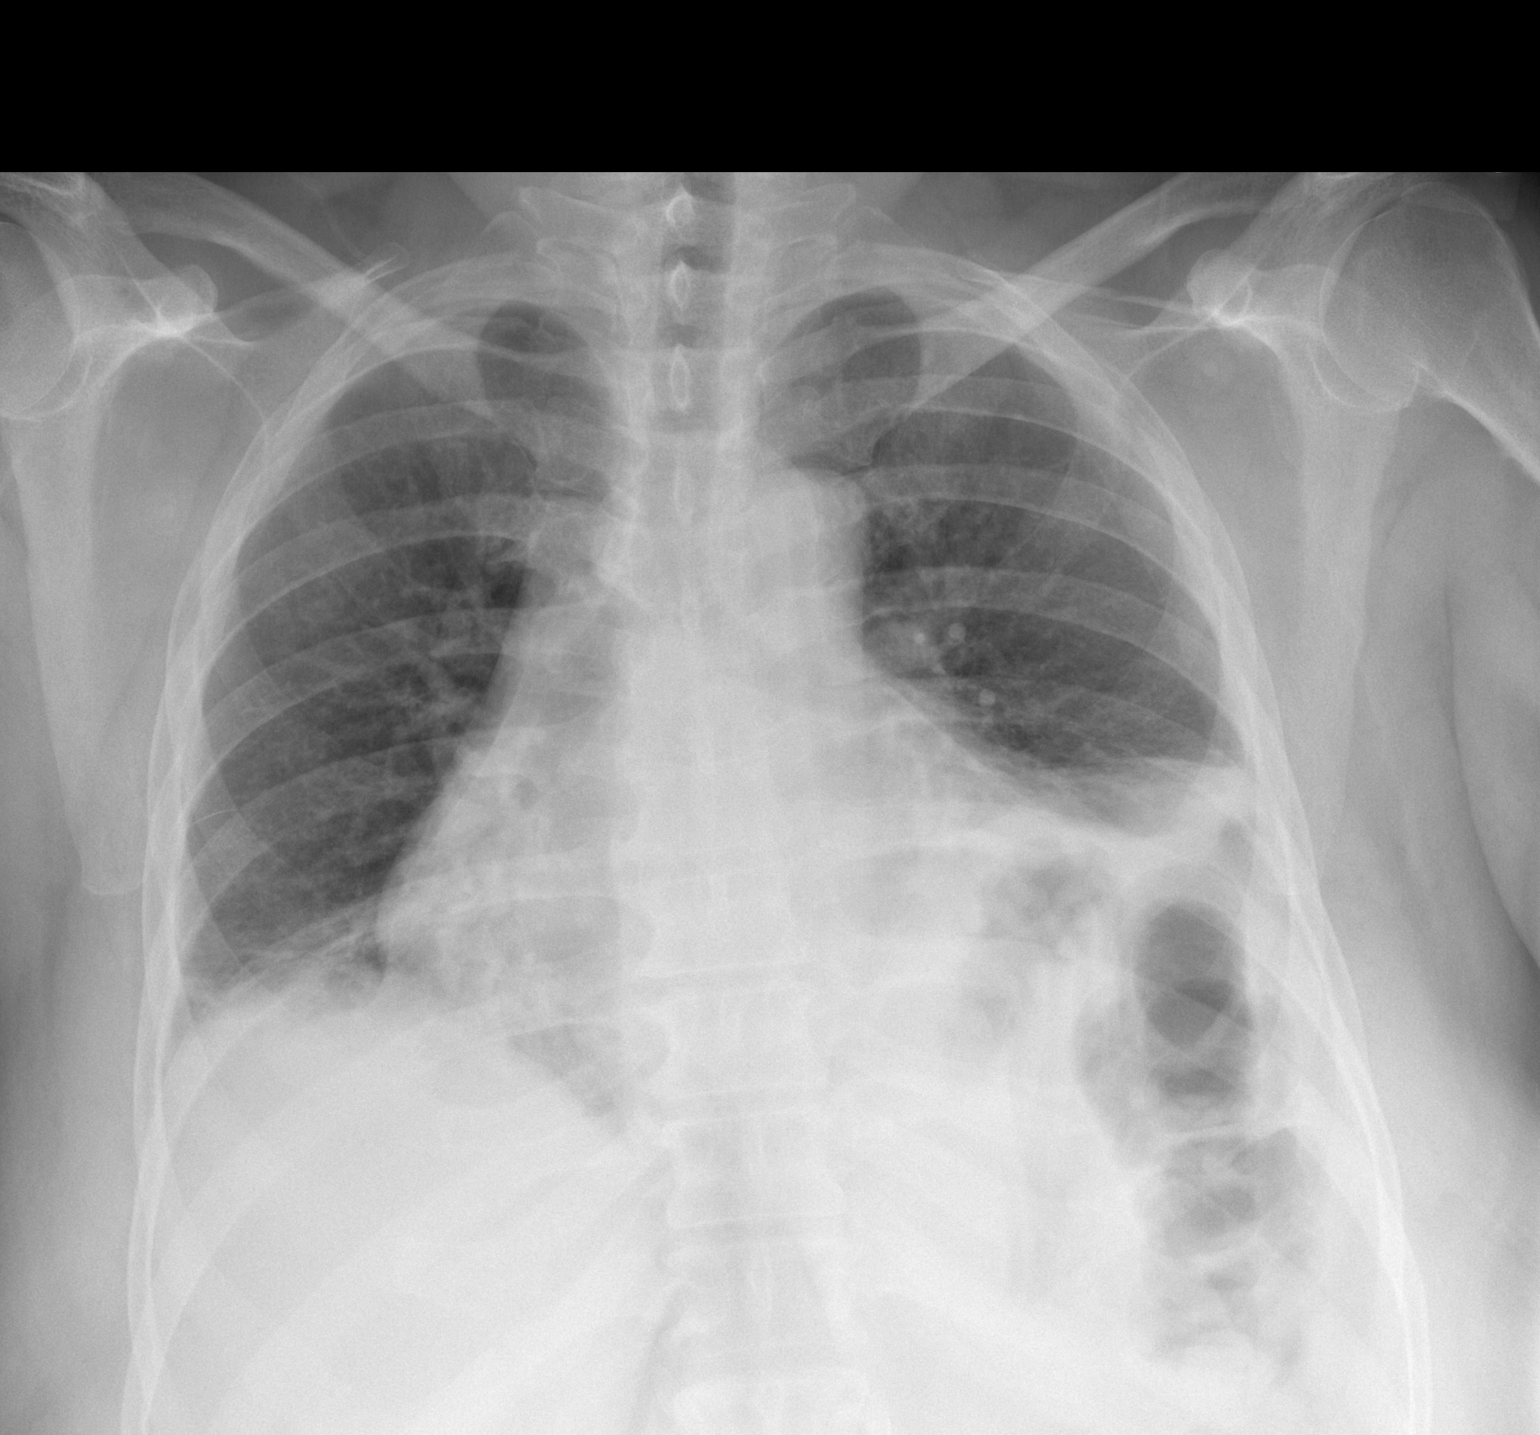

[w chest lat]
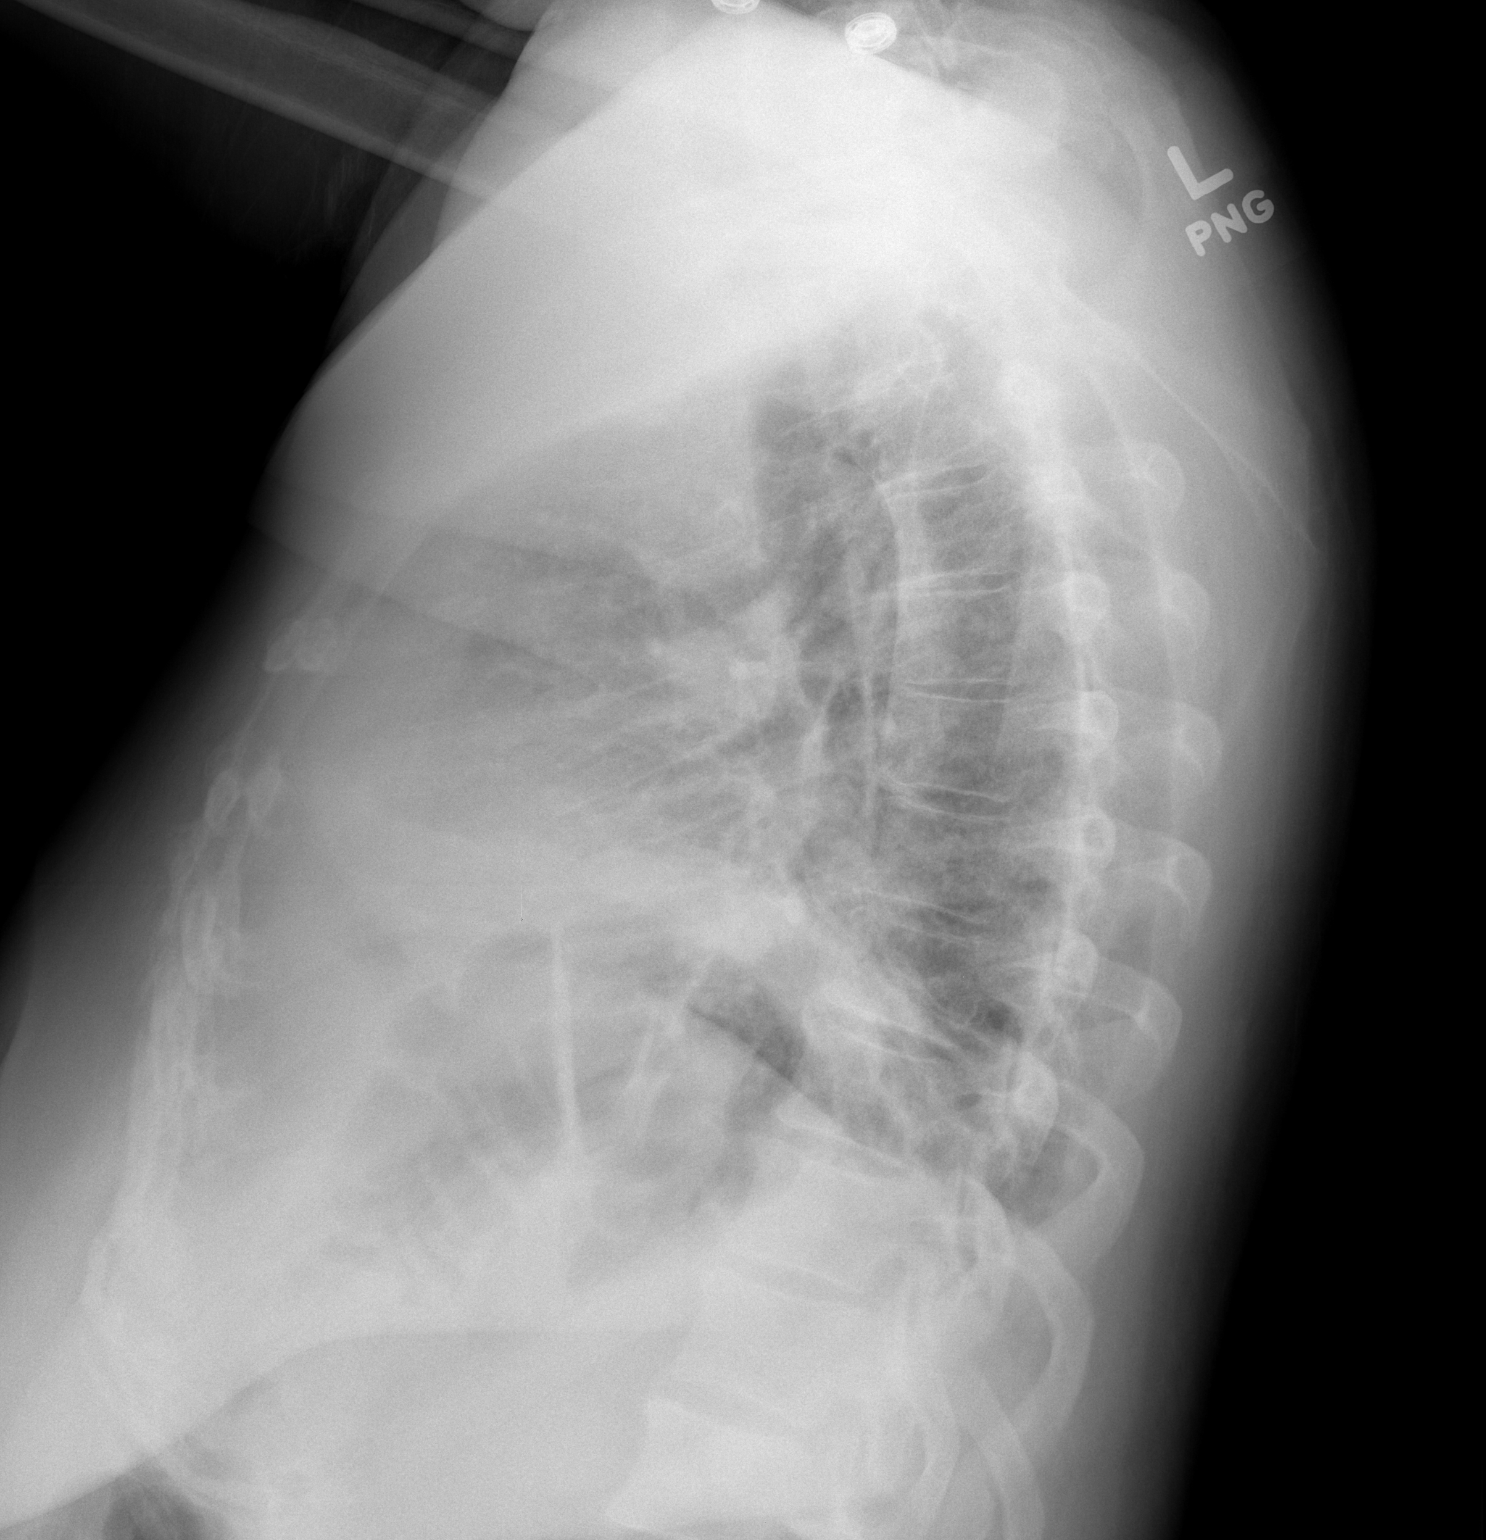

[2 of 2 positions shown; findings below may reference images not displayed]

FINDINGS: The left hemidiaphragm remains elevated. There is new increased
density above the hemidiaphragm consistent with atelectasis or
pneumonia. On the right new increased density just above the
hemidiaphragm is present as well but is less conspicuous than that
on the left. The cardiac silhouette is enlarged. The pulmonary
vascularity is not engorged. The mediastinum is normal in width. The
observed portions of the bony thorax exhibit no acute abnormalities.
IMPRESSION: Since the previous study there developed areas of atelectasis or
pneumonia at both lung bases greater on the left than on the right.
No pulmonary vascular congestion is demonstrated.

## 2014-04-29 IMAGING — CR DG CHEST 2V
2 series · 2 of 2 positions shown · non-contrast
Comparison: 06/16/2013

CLINICAL DATA: Follow-up pneumonia

EXAM:
CHEST  2 VIEW

[w chest pa]
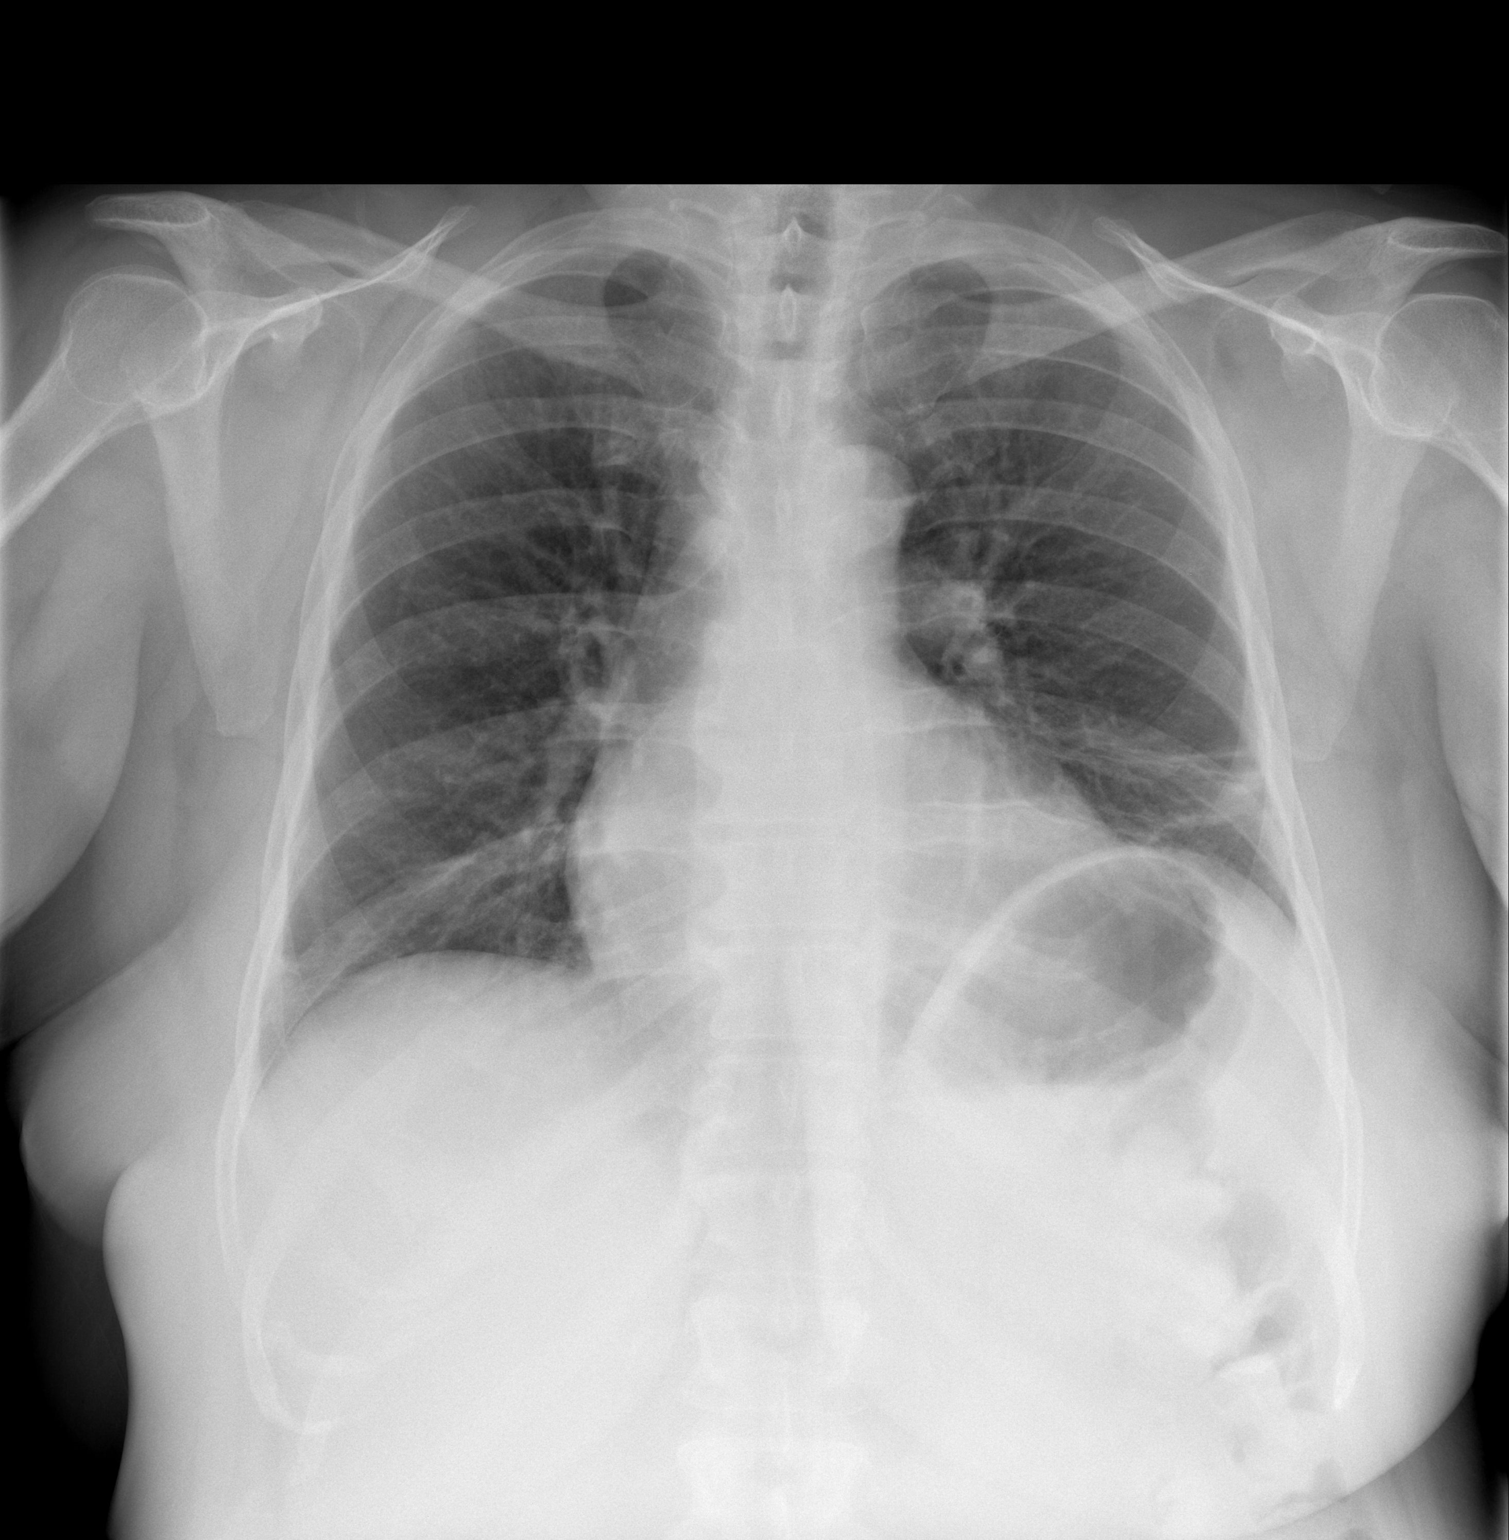

[w chest lat]
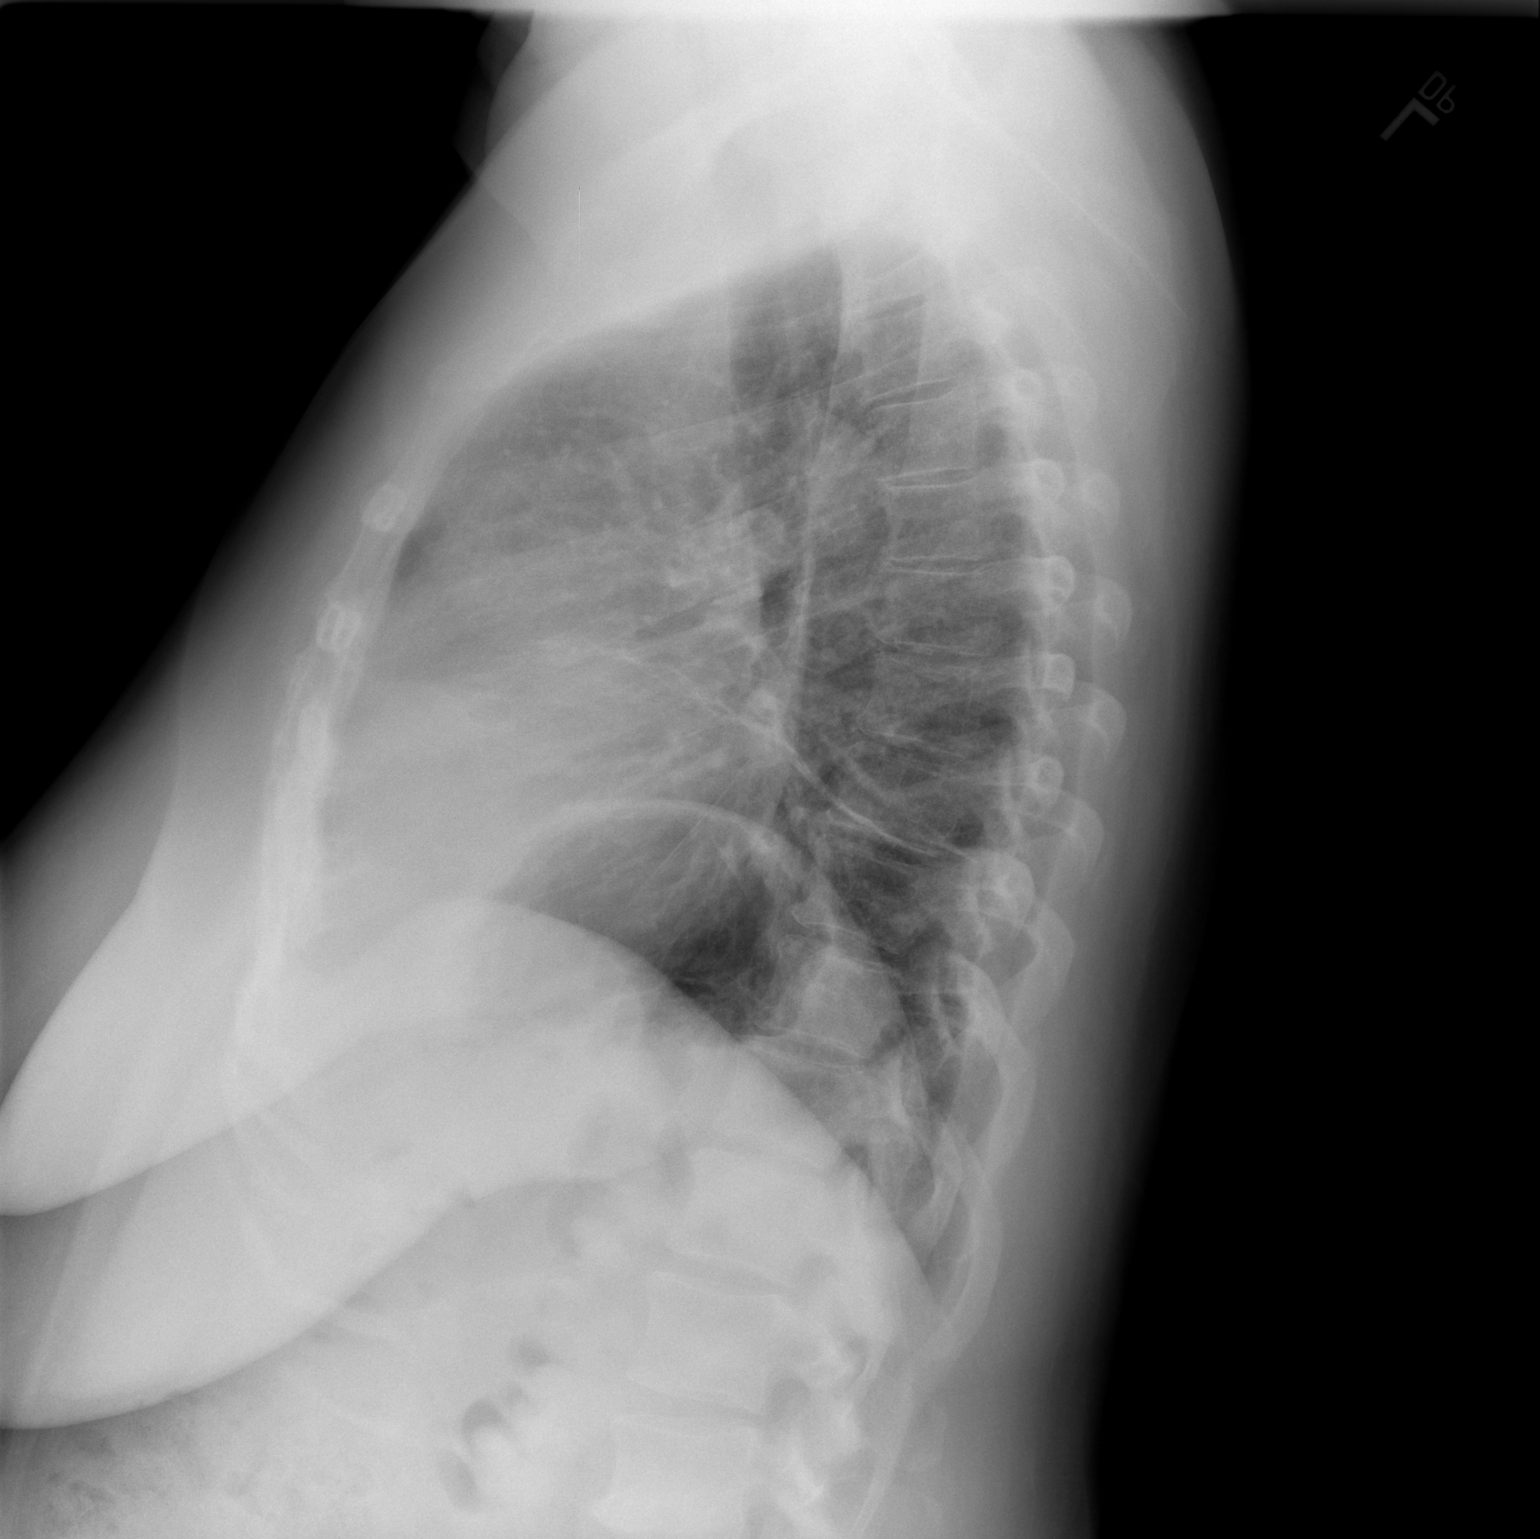

[2 of 2 positions shown; findings below may reference images not displayed]

FINDINGS: Mild residual linear/patchy opacity in the left lower lobe/lingula
and lateral right lower lobe, improved. No pleural effusion or
pneumothorax.

The heart is normal in size.

Mild degenerative changes of the visualized thoracolumbar spine.
IMPRESSION: Mild residual linear/patchy opacity in the left lower lobe/lingula
and right lower lobe, favored to reflect resolving pneumonia.

## 2014-06-10 ENCOUNTER — Other Ambulatory Visit (HOSPITAL_COMMUNITY): Payer: Self-pay | Admitting: Family Medicine

## 2014-06-10 DIAGNOSIS — Z1231 Encounter for screening mammogram for malignant neoplasm of breast: Secondary | ICD-10-CM

## 2014-07-15 ENCOUNTER — Ambulatory Visit (HOSPITAL_COMMUNITY)
Admission: RE | Admit: 2014-07-15 | Discharge: 2014-07-15 | Disposition: A | Discharge status: Home / Self Care | Payer: BC Managed Care – PPO | Source: Ambulatory Visit | Attending: Family Medicine | Admitting: Family Medicine

## 2014-07-15 DIAGNOSIS — Z1231 Encounter for screening mammogram for malignant neoplasm of breast: Secondary | ICD-10-CM | POA: Insufficient documentation

## 2015-06-09 ENCOUNTER — Other Ambulatory Visit: Payer: Self-pay

## 2015-06-09 DIAGNOSIS — Z1231 Encounter for screening mammogram for malignant neoplasm of breast: Secondary | ICD-10-CM

## 2015-07-16 ENCOUNTER — Ambulatory Visit
Admission: RE | Admit: 2015-07-16 | Discharge: 2015-07-16 | Disposition: A | Discharge status: Home / Self Care | Payer: BC Managed Care – PPO | Source: Ambulatory Visit

## 2015-07-16 DIAGNOSIS — Z1231 Encounter for screening mammogram for malignant neoplasm of breast: Secondary | ICD-10-CM

## 2016-06-04 ENCOUNTER — Other Ambulatory Visit: Payer: Self-pay | Admitting: Family Medicine

## 2016-06-04 DIAGNOSIS — Z1231 Encounter for screening mammogram for malignant neoplasm of breast: Secondary | ICD-10-CM

## 2016-07-19 ENCOUNTER — Ambulatory Visit
Admission: RE | Admit: 2016-07-19 | Discharge: 2016-07-19 | Disposition: A | Discharge status: Home / Self Care | Payer: BC Managed Care – PPO | Source: Ambulatory Visit | Attending: Family Medicine | Admitting: Family Medicine

## 2016-07-19 DIAGNOSIS — Z1231 Encounter for screening mammogram for malignant neoplasm of breast: Secondary | ICD-10-CM

## 2017-06-10 ENCOUNTER — Other Ambulatory Visit: Payer: Self-pay | Admitting: Family Medicine

## 2017-06-10 DIAGNOSIS — Z1231 Encounter for screening mammogram for malignant neoplasm of breast: Secondary | ICD-10-CM

## 2017-08-10 ENCOUNTER — Ambulatory Visit
Admission: RE | Admit: 2017-08-10 | Discharge: 2017-08-10 | Disposition: A | Discharge status: Home / Self Care | Payer: BC Managed Care – PPO | Source: Ambulatory Visit | Attending: Family Medicine | Admitting: Family Medicine

## 2017-08-10 DIAGNOSIS — Z1231 Encounter for screening mammogram for malignant neoplasm of breast: Secondary | ICD-10-CM

## 2018-07-04 ENCOUNTER — Other Ambulatory Visit: Payer: Self-pay | Admitting: Family Medicine

## 2018-07-04 DIAGNOSIS — Z1231 Encounter for screening mammogram for malignant neoplasm of breast: Secondary | ICD-10-CM

## 2018-08-30 ENCOUNTER — Other Ambulatory Visit: Payer: Self-pay

## 2018-08-30 ENCOUNTER — Ambulatory Visit
Admission: RE | Admit: 2018-08-30 | Discharge: 2018-08-30 | Disposition: A | Discharge status: Home / Self Care | Payer: BC Managed Care – PPO | Source: Ambulatory Visit | Attending: Family Medicine | Admitting: Family Medicine

## 2018-08-30 DIAGNOSIS — Z1231 Encounter for screening mammogram for malignant neoplasm of breast: Secondary | ICD-10-CM

## 2018-09-01 ENCOUNTER — Ambulatory Visit: Payer: BC Managed Care – PPO

## 2019-07-23 ENCOUNTER — Other Ambulatory Visit: Payer: Self-pay | Admitting: Family Medicine

## 2019-07-23 DIAGNOSIS — Z1231 Encounter for screening mammogram for malignant neoplasm of breast: Secondary | ICD-10-CM

## 2019-08-31 ENCOUNTER — Ambulatory Visit
Admission: RE | Admit: 2019-08-31 | Discharge: 2019-08-31 | Disposition: A | Discharge status: Home / Self Care | Payer: BC Managed Care – PPO | Source: Ambulatory Visit | Attending: Family Medicine | Admitting: Family Medicine

## 2019-08-31 ENCOUNTER — Other Ambulatory Visit: Payer: Self-pay

## 2019-08-31 DIAGNOSIS — Z1231 Encounter for screening mammogram for malignant neoplasm of breast: Secondary | ICD-10-CM

## 2020-02-05 ENCOUNTER — Other Ambulatory Visit: Payer: Self-pay | Admitting: Family Medicine

## 2020-02-05 DIAGNOSIS — E2839 Other primary ovarian failure: Secondary | ICD-10-CM

## 2020-05-20 ENCOUNTER — Other Ambulatory Visit: Payer: Self-pay

## 2020-05-20 ENCOUNTER — Ambulatory Visit
Admission: RE | Admit: 2020-05-20 | Discharge: 2020-05-20 | Disposition: A | Discharge status: Home / Self Care | Payer: BC Managed Care – PPO | Source: Ambulatory Visit | Attending: Family Medicine | Admitting: Family Medicine

## 2020-05-20 DIAGNOSIS — E2839 Other primary ovarian failure: Secondary | ICD-10-CM

## 2020-07-28 ENCOUNTER — Other Ambulatory Visit: Payer: Self-pay | Admitting: Family Medicine

## 2020-07-28 DIAGNOSIS — Z1231 Encounter for screening mammogram for malignant neoplasm of breast: Secondary | ICD-10-CM

## 2020-09-15 ENCOUNTER — Ambulatory Visit
Admission: RE | Admit: 2020-09-15 | Discharge: 2020-09-15 | Disposition: A | Discharge status: Home / Self Care | Payer: BC Managed Care – PPO | Source: Ambulatory Visit | Attending: Family Medicine | Admitting: Family Medicine

## 2020-09-15 ENCOUNTER — Other Ambulatory Visit: Payer: Self-pay

## 2020-09-15 DIAGNOSIS — Z1231 Encounter for screening mammogram for malignant neoplasm of breast: Secondary | ICD-10-CM

## 2021-02-26 ENCOUNTER — Other Ambulatory Visit: Payer: Self-pay | Admitting: Otolaryngology

## 2021-03-03 LAB — SURGICAL PATHOLOGY

## 2021-08-07 ENCOUNTER — Other Ambulatory Visit: Payer: Self-pay | Admitting: Family Medicine

## 2021-08-07 DIAGNOSIS — Z1231 Encounter for screening mammogram for malignant neoplasm of breast: Secondary | ICD-10-CM

## 2021-09-16 ENCOUNTER — Ambulatory Visit
Admission: RE | Admit: 2021-09-16 | Discharge: 2021-09-16 | Disposition: A | Discharge status: Home / Self Care | Payer: BC Managed Care – PPO | Source: Ambulatory Visit | Attending: Family Medicine | Admitting: Family Medicine

## 2021-09-16 DIAGNOSIS — Z1231 Encounter for screening mammogram for malignant neoplasm of breast: Secondary | ICD-10-CM

## 2022-06-13 ENCOUNTER — Emergency Department (HOSPITAL_COMMUNITY): Payer: BC Managed Care – PPO

## 2022-06-13 ENCOUNTER — Other Ambulatory Visit: Payer: Self-pay

## 2022-06-13 ENCOUNTER — Emergency Department (HOSPITAL_COMMUNITY)
Admission: EM | Admit: 2022-06-13 | Discharge: 2022-06-13 | Disposition: A | Discharge status: Home / Self Care | Payer: BC Managed Care – PPO | Attending: Emergency Medicine | Admitting: Emergency Medicine

## 2022-06-13 ENCOUNTER — Encounter (HOSPITAL_COMMUNITY): Payer: Self-pay

## 2022-06-13 DIAGNOSIS — Q248 Other specified congenital malformations of heart: Secondary | ICD-10-CM

## 2022-06-13 DIAGNOSIS — I318 Other specified diseases of pericardium: Secondary | ICD-10-CM | POA: Diagnosis not present

## 2022-06-13 DIAGNOSIS — R0789 Other chest pain: Secondary | ICD-10-CM | POA: Insufficient documentation

## 2022-06-13 DIAGNOSIS — I2782 Chronic pulmonary embolism: Secondary | ICD-10-CM | POA: Insufficient documentation

## 2022-06-13 DIAGNOSIS — R079 Chest pain, unspecified: Secondary | ICD-10-CM | POA: Diagnosis present

## 2022-06-13 DIAGNOSIS — I10 Essential (primary) hypertension: Secondary | ICD-10-CM | POA: Insufficient documentation

## 2022-06-13 LAB — CBC WITH DIFFERENTIAL/PLATELET
Abs Immature Granulocytes: 0.04 10*3/uL (ref 0.00–0.07)
Basophils Absolute: 0.1 10*3/uL (ref 0.0–0.1)
Basophils Relative: 1 %
Eosinophils Absolute: 0.3 10*3/uL (ref 0.0–0.5)
Eosinophils Relative: 3 %
HCT: 40.8 % (ref 36.0–46.0)
Hemoglobin: 13.1 g/dL (ref 12.0–15.0)
Immature Granulocytes: 0 %
Lymphocytes Relative: 27 %
Lymphs Abs: 2.5 10*3/uL (ref 0.7–4.0)
MCH: 27 pg (ref 26.0–34.0)
MCHC: 32.1 g/dL (ref 30.0–36.0)
MCV: 84.1 fL (ref 80.0–100.0)
Monocytes Absolute: 0.6 10*3/uL (ref 0.1–1.0)
Monocytes Relative: 6 %
Neutro Abs: 6 10*3/uL (ref 1.7–7.7)
Neutrophils Relative %: 63 %
Platelets: 223 10*3/uL (ref 150–400)
RBC: 4.85 MIL/uL (ref 3.87–5.11)
RDW: 14.6 % (ref 11.5–15.5)
WBC: 9.5 10*3/uL (ref 4.0–10.5)
nRBC: 0 % (ref 0.0–0.2)

## 2022-06-13 LAB — BASIC METABOLIC PANEL
Anion gap: 8 (ref 5–15)
BUN: 18 mg/dL (ref 8–23)
CO2: 25 mmol/L (ref 22–32)
Calcium: 9.5 mg/dL (ref 8.9–10.3)
Chloride: 107 mmol/L (ref 98–111)
Creatinine, Ser: 0.68 mg/dL (ref 0.44–1.00)
GFR, Estimated: >60 mL/min (ref >60)
Glucose, Bld: 93 mg/dL (ref 70–99)
Potassium: 3.7 mmol/L (ref 3.5–5.1)
Sodium: 140 mmol/L (ref 135–145)

## 2022-06-13 LAB — D-DIMER, QUANTITATIVE: D-Dimer, Quant: 1.99 ug/mL-FEU — ABNORMAL HIGH (ref 0.00–0.50)

## 2022-06-13 LAB — TROPONIN I (HIGH SENSITIVITY)
Troponin I (High Sensitivity): 6 ng/L (ref <18)
Troponin I (High Sensitivity): 5 ng/L (ref <18)

## 2022-06-13 MED ORDER — RIVAROXABAN 15 MG PO TABS
15.0000 mg | ORAL_TABLET | Freq: Once | ORAL | Status: AC
  Administered 2022-06-13: 15 mg via ORAL
  Filled 2022-06-13: qty 1

## 2022-06-13 MED ORDER — IOHEXOL 350 MG/ML SOLN
75.0000 mL | Freq: Once | INTRAVENOUS | Status: AC | PRN
  Administered 2022-06-13: 75 mL via INTRAVENOUS

## 2022-06-13 MED ORDER — RIVAROXABAN (XARELTO) VTE STARTER PACK (15 & 20 MG): ORAL_TABLET | ORAL | 0 refills | Status: DC

## 2022-06-13 MED ORDER — RIVAROXABAN (XARELTO) EDUCATION KIT FOR DVT/PE PATIENTS
PACK | Freq: Once | Status: DC
  Filled 2022-06-13: qty 1

## 2022-06-13 NOTE — ED Provider Notes (Signed)
[facility] Provider Note   CSN: [id number] Arrival date & time: 06/13/22  0310     History  Chief Complaint  Patient presents with   Chest Pain    Pt presents to ED for evaluation of left sided chest pain under her left breast. Pt describes pain as sharp.     Renee Bennett is a 67 y.o. female.  HPI     This is a 67 year old female with history of hypertension who presents with chest pain.  Patient reports she has had some ongoing chest pain that is sharp into her left axilla.  She states that this has been ongoing for the last month or so.  However, just prior to arrival for approximately 2 hours she had indigestion type pain which is new.  No shortness of breath, cough, fevers.  No exertional symptoms.  Nothing seems to make the pain better or worse.  Home Medications Prior to Admission medications   Medication Sig Start Date End Date Taking? Authorizing Provider  RIVAROXABAN Alveda Reasons) VTE STARTER PACK (15 & 20 MG) Follow package directions: Take one '15mg'$  tablet by mouth twice a day. On day 22, switch to one '20mg'$  tablet once a day. Take with food. 06/13/22  Yes Zalea Pete, Barbette Hair, MD  benazepril-hydrochlorthiazide (LOTENSIN HCT) 20-12.5 MG per tablet Take 1 tablet by mouth daily.    [provider]  calcium carbonate (TUMS - DOSED IN MG ELEMENTAL CALCIUM) 500 MG chewable tablet Chew 2 tablets by mouth daily as needed for indigestion or heartburn.    [provider]  esomeprazole (NEXIUM 24HR) 20 MG capsule Take 20 mg by mouth 2 (two) times daily before a meal.    [provider]  ibuprofen (ADVIL,MOTRIN) 600 MG tablet Take 1 tablet (600 mg total) by mouth every 8 (eight) hours. 06/16/13   Maude Leriche, PA-C  levofloxacin (LEVAQUIN) 750 MG tablet Take 1 tablet (750 mg total) by mouth daily. X 10 days 06/16/13   Maude Leriche, PA-C  simvastatin (ZOCOR) 40 MG tablet Take 40 mg by mouth daily.    [provider]      Allergies    Patient has no known allergies.    Review of Systems   Review of Systems  Constitutional:  Negative for fever.  Respiratory:  Negative for shortness of breath.   Cardiovascular:  Positive for chest pain.  All other systems reviewed and are negative.   Physical Exam Updated Vital Signs BP 135/72   Pulse 68   Temp 97.8 F (36.6 C) (Oral)   Resp 19   Ht 1.676 m ('5\' 6"'$ )   Wt 106.6 kg   SpO2 95%   BMI 37.93 kg/m  Physical Exam Vitals and nursing note reviewed.  Constitutional:      Appearance: She is well-developed. She is obese. She is not ill-appearing.  HENT:     Head: Normocephalic and atraumatic.  Eyes:     Pupils: Pupils are equal, round, and reactive to light.  Cardiovascular:     Rate and Rhythm: Normal rate and regular rhythm.     Heart sounds: Normal heart sounds.  Pulmonary:     Effort: Pulmonary effort is normal. No respiratory distress.     Breath sounds: No wheezing.  Chest:     Chest wall: No tenderness.  Abdominal:     General: Bowel sounds are normal.     Palpations: Abdomen is soft.  Musculoskeletal:     Cervical  back: Neck supple.  Skin:    General: Skin is warm and dry.  Neurological:     General: No focal deficit present.     Mental Status: She is alert and oriented to person, place, and time.     ED Results / Procedures / Treatments   Labs (all labs ordered are listed, but only abnormal results are displayed) Labs Reviewed  D-DIMER, QUANTITATIVE - Abnormal; Notable for the following components:      Result Value   D-Dimer, Quant 1.99 (*)    All other components within normal limits  CBC WITH DIFFERENTIAL/PLATELET  BASIC METABOLIC PANEL  TROPONIN I (HIGH SENSITIVITY)  TROPONIN I (HIGH SENSITIVITY)    EKG EKG Interpretation  Date/Time:  Sunday June 13 2022 03:11:50 EST Ventricular Rate:  73 PR Interval:  187 QRS Duration: 102 QT Interval:  402 QTC Calculation: 443 R Axis:   57 Text  Interpretation: Sinus rhythm Confirmed by Thayer Jew [id number]) on 06/13/2022 4:21:18 AM  Radiology CT Angio Chest PE W and/or Wo Contrast  Result Date: 06/13/2022 CLINICAL DATA:  Positive D-dimer. Clinical concern for pulmonary embolism. EXAM: CT ANGIOGRAPHY CHEST WITH CONTRAST TECHNIQUE: Multidetector CT imaging of the chest was performed using the standard protocol during bolus administration of intravenous contrast. Multiplanar CT image reconstructions and MIPs were obtained to evaluate the vascular anatomy. RADIATION DOSE REDUCTION: This exam was performed according to the departmental dose-optimization program which includes automated exposure control, adjustment of the mA and/or kV according to patient size and/or use of iterative reconstruction technique. CONTRAST:  47m OMNIPAQUE IOHEXOL 350 MG/ML SOLN COMPARISON:  None Available. FINDINGS: Cardiovascular: Heart is enlarged. Mild atherosclerotic calcification is noted in the wall of the thoracic aorta. No large central pulmonary embolus evident. No filling defect in left-sided pulmonary arteries to suggest segmental or subsegmental pulmonary embolus. No evidence for pulmonary embolus in the middle or lower lobe distribution. Patient is noted to have a nonocclusive, thin linear filling defect in a segmental branch to the posterior right upper lobe (see axial image 102/series 11 and coronal image 103/series 15). 3.5 cm well-defined homogeneous lesion in the anterior mediastinum shows no perceptible wall or internal architecture and measures water density. Imaging features compatible with pericardial cyst. Mediastinum/Nodes: No mediastinal lymphadenopathy. There is no hilar lymphadenopathy. The esophagus has normal imaging features. There is no axillary lymphadenopathy. Lungs/Pleura: Dependent atelectasis noted in both lungs. No focal airspace consolidation or substantial pleural effusion. No suspicious pulmonary nodule or mass. Upper Abdomen: Visualized  portion of the upper abdomen is unremarkable. Musculoskeletal: No worrisome lytic or sclerotic osseous abnormality. Review of the MIP images confirms the above findings. IMPRESSION: 1. Nonocclusive, thin linear filling defect in a segmental branch to the posterior right upper lobe. 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 2. 3.5 cm well-defined homogeneous lesion in the anterior mediastinum shows no perceptible wall or internal architecture and measures water density. Imaging features compatible with pericardial cyst. Follow-up CT chest without contrast in 3-6 months could be used to ensure stability. 3.  Aortic Atherosclerosis (ICD10-I70.0). Tiny, Electronically Signed   By: EMisty StanleyM.D.   On: 06/13/2022 05:49   DG Chest Portable 1 View  Result Date: 06/13/2022 CLINICAL DATA:  Chest pain EXAM: PORTABLE CHEST 1 VIEW COMPARISON:  07/02/2013 FINDINGS: Heart and mediastinal contours are within normal limits. No focal opacities or effusions. No acute bony abnormality. IMPRESSION: No active disease. Electronically Signed   By: KRolm BaptiseM.D.   On: 06/13/2022 03:31  Procedures Procedures    Medications Ordered in ED Medications  rivaroxaban (XARELTO) Education Kit for DVT/PE patients (has no administration in time range)  Rivaroxaban (XARELTO) tablet 15 mg (has no administration in time range)  iohexol (OMNIPAQUE) 350 MG/ML injection 75 mL (75 mLs Intravenous Contrast Given 06/13/22 0537)    ED Course/ Medical Decision Making/ A&P                             Medical Decision Making Amount and/or Complexity of Data Reviewed Labs: ordered. Radiology: ordered.  Risk Prescription drug management.   This patient presents to the ED for concern of chest pain, this involves an extensive number of treatment options, and is a complaint that carries with it a high risk of complications and morbidity.  I considered the following differential and admission  for this acute, potentially life threatening condition.  The differential diagnosis includes ACS, PE, pneumothorax, pneumonia, chest wall pain, pericarditis  MDM:    This is a 67 year old female who presents with chest pain.  Acute on chronic in nature.  She had new onset of pain that felt more like heartburn this evening.  She is nontoxic and vital signs are reassuring.  She is obese and has a history of hypertension.  EKG is normal.  Troponin x 2 negative.  Doubt primary ACS.  I did screen for blood clot with D-dimer.  This was positive.  CT scan performed and obtained.  Likely small chronic appearing clot in the right lung.  This would be the opposite side of her pain.  Unsure whether this is related although since it has been found, feel that it should be treated.  Additionally she has what appears to be a small pericardial cyst with recommended imaging follow-up.  No pneumothorax, no pneumonia.  Patient remained hemodynamically stable.  She is in no respiratory distress.  Satting well on room air.  Will start on Xarelto and have her follow-up closely with her primary doctor.  She and her family were advised of all findings.  (Labs, imaging, consults)  Labs: I Ordered, and personally interpreted labs.  The pertinent results include: CBC, BMP, troponin x 2 Imaging Studies ordered: I ordered imaging studies including chest x-ray, CT chest I independently visualized and interpreted imaging. I agree with the radiologist interpretation  Additional history obtained from family at bedside.  External records from outside source obtained and reviewed including prior evaluations  Cardiac Monitoring: The patient was maintained on a cardiac monitor.  If on the cardiac monitor, I personally viewed and interpreted the cardiac monitored which showed an underlying rhythm of: Sinus rhythm  Reevaluation: After the interventions noted above, I reevaluated the patient and found that they have  :improved  Social Determinants of Health:  lives independently  Disposition: Discharge  Co morbidities that complicate the patient evaluation  Past Medical History:  Diagnosis Date   Hypercholesterolemia    Hypertension      Medicines Meds ordered this encounter  Medications   iohexol (OMNIPAQUE) 350 MG/ML injection 75 mL   rivaroxaban (XARELTO) Education Kit for DVT/PE patients   Rivaroxaban (XARELTO) tablet 15 mg   RIVAROXABAN (XARELTO) VTE STARTER PACK (15 & 20 MG)    Sig: Follow package directions: Take one '15mg'$  tablet by mouth twice a day. On day 22, switch to one '20mg'$  tablet once a day. Take with food.    Dispense:  51 each    Refill:  0  I have reviewed the patients home medicines and have made adjustments as needed  Problem List / ED Course: Problem List Items Addressed This Visit   None Visit Diagnoses     Atypical chest pain    -  Primary   Relevant Orders   Ambulatory referral to Cardiology   Chronic pulmonary embolism without acute cor pulmonale, unspecified pulmonary embolism type ([place])       Relevant Medications   Rivaroxaban (XARELTO) tablet 15 mg (Start on 06/13/2022 10:00 AM)   RIVAROXABAN (XARELTO) VTE STARTER PACK (15 & 20 MG)   Pericardial cyst       Relevant Medications   Rivaroxaban (XARELTO) tablet 15 mg (Start on 06/13/2022 10:00 AM)   RIVAROXABAN (XARELTO) VTE STARTER PACK (15 & 20 MG)                   Final Clinical Impression(s) / ED Diagnoses Final diagnoses:  Atypical chest pain  Chronic pulmonary embolism without acute cor pulmonale, unspecified pulmonary embolism type ([place])  Pericardial cyst    Rx / DC Orders ED Discharge Orders          Ordered    RIVAROXABAN (XARELTO) VTE STARTER PACK (15 & 20 MG)        06/13/22 0641    Ambulatory referral to Cardiology        06/13/22 0642              Merryl Hacker, MD 06/13/22 [phone]

## 2022-06-13 NOTE — Discharge Instructions (Addendum)
You were seen today for chest pain.  Your cardiac workup is reassuring.  However, you have a small what appears to be chronic pulmonary embolism.  This is a blood clot.  You will be started on blood thinners.  Follow-up closely with your primary doctor for recheck.  Start Xarelto as instructed.  Additionally, you appear to have a small pericardial cyst.  Recommend repeat CT imaging in 3 to 6 months for stability.  You will be referred to cardiology given your ongoing chest pain.

## 2022-07-21 ENCOUNTER — Ambulatory Visit: Payer: BC Managed Care – PPO | Admitting: Internal Medicine

## 2022-07-27 ENCOUNTER — Emergency Department (HOSPITAL_COMMUNITY): Payer: BC Managed Care – PPO

## 2022-07-27 ENCOUNTER — Emergency Department (HOSPITAL_COMMUNITY)
Admission: EM | Admit: 2022-07-27 | Discharge: 2022-07-27 | Disposition: A | Discharge status: Home / Self Care | Payer: BC Managed Care – PPO | Attending: Emergency Medicine | Admitting: Emergency Medicine

## 2022-07-27 ENCOUNTER — Encounter (HOSPITAL_COMMUNITY): Payer: Self-pay

## 2022-07-27 DIAGNOSIS — S42355A Nondisplaced comminuted fracture of shaft of humerus, left arm, initial encounter for closed fracture: Secondary | ICD-10-CM | POA: Diagnosis not present

## 2022-07-27 DIAGNOSIS — S92422A Displaced fracture of distal phalanx of left great toe, initial encounter for closed fracture: Secondary | ICD-10-CM | POA: Insufficient documentation

## 2022-07-27 DIAGNOSIS — S0181XA Laceration without foreign body of other part of head, initial encounter: Secondary | ICD-10-CM

## 2022-07-27 DIAGNOSIS — Z7901 Long term (current) use of anticoagulants: Secondary | ICD-10-CM | POA: Insufficient documentation

## 2022-07-27 DIAGNOSIS — W01198A Fall on same level from slipping, tripping and stumbling with subsequent striking against other object, initial encounter: Secondary | ICD-10-CM | POA: Insufficient documentation

## 2022-07-27 DIAGNOSIS — Y92512 Supermarket, store or market as the place of occurrence of the external cause: Secondary | ICD-10-CM | POA: Diagnosis not present

## 2022-07-27 DIAGNOSIS — S4992XA Unspecified injury of left shoulder and upper arm, initial encounter: Secondary | ICD-10-CM | POA: Diagnosis present

## 2022-07-27 DIAGNOSIS — Z79899 Other long term (current) drug therapy: Secondary | ICD-10-CM | POA: Diagnosis not present

## 2022-07-27 DIAGNOSIS — S92402A Displaced unspecified fracture of left great toe, initial encounter for closed fracture: Secondary | ICD-10-CM

## 2022-07-27 MED ORDER — HYDROCODONE-ACETAMINOPHEN 5-325 MG PO TABS: 1.0000 | ORAL_TABLET | Freq: Four times a day (QID) | ORAL | 0 refills | Status: DC | PRN

## 2022-07-27 NOTE — ED Notes (Signed)
Ortho paged x 2. 

## 2022-07-27 NOTE — ED Triage Notes (Signed)
Pt bib ems from a store; pt had mechanical fall, tripped and  fell; no loc; on xarelto for hx PE; lac above L eye; bleeding controlled, wrapped on ems arrival; L shoulder swelling, immobility L arm; small deformity L wrist; pms intact; HTN hx, has not taken meds today; 181/96, HR 82, 95% RA

## 2022-07-27 NOTE — ED Notes (Signed)
Patient requesting to speak with doctor concerning x-rays, MD Dixon to bedside. Discharge instructions explained. Patient verbalizes understanding of discharge instructions. Opportunity for questioning and answers were provided. Armband removed by staff, pt discharged from ED. Pt taken to ED waiting room via wheelchair.

## 2022-07-27 NOTE — ED Notes (Signed)
Patient transported to X-ray 

## 2022-07-27 NOTE — Progress Notes (Signed)
Orthopedic Tech Progress Note Patient Details:  SHARDA KEDDY 11/03/1955 [id number]  Level 2 trauma   Patient ID: Laurel Dimmer, female   DOB: 02-Apr-1956, 67 y.o.   MRN: [id number]  Donald Pore 07/27/2022, 1:26 PM

## 2022-07-27 NOTE — ED Provider Notes (Signed)
[place] EMERGENCY DEPARTMENT AT [facility] Provider Note   CSN: [id number] Arrival date & time: 07/27/22  1229     History  No chief complaint on file.   Renee Bennett is a 67 y.o. female.  67 year old female with prior medical history as detailed below presents for evaluation.  Patient reports that she tripped and fell while at a store.  She did strike her head.  She also landed hard on her left shoulder.  She complains of pain to the left shoulder and to the left great toe.  She does take Xarelto.  She denies LOC.  She denies neck pain.    The history is provided by the patient and medical records.       Home Medications Prior to Admission medications   Medication Sig Start Date End Date Taking? Authorizing Provider  HYDROcodone-acetaminophen (NORCO/VICODIN) 5-325 MG tablet Take 1 tablet by mouth every 6 (six) hours as needed. 07/27/22  Yes Wynetta Fines, MD  benazepril-hydrochlorthiazide (LOTENSIN HCT) 20-12.5 MG per tablet Take 1 tablet by mouth daily.    [provider]  calcium carbonate (TUMS - DOSED IN MG ELEMENTAL CALCIUM) 500 MG chewable tablet Chew 2 tablets by mouth daily as needed for indigestion or heartburn.    [provider]  esomeprazole (NEXIUM 24HR) 20 MG capsule Take 20 mg by mouth 2 (two) times daily before a meal.    [provider]  ibuprofen (ADVIL,MOTRIN) 600 MG tablet Take 1 tablet (600 mg total) by mouth every 8 (eight) hours. 06/16/13   Cristobal Goldmann, PA-C  levofloxacin (LEVAQUIN) 750 MG tablet Take 1 tablet (750 mg total) by mouth daily. X 10 days 06/16/13   Cristobal Goldmann, PA-C  RIVAROXABAN Carlena Hurl) VTE STARTER PACK (15 & 20 MG) Follow package directions: Take one 15mg  tablet by mouth twice a day. On day 22, switch to one 20mg  tablet once a day. Take with food. 06/13/22   Horton, Mayer Masker, MD  simvastatin (ZOCOR) 40 MG tablet Take 40 mg by mouth daily.    [provider]      Allergies     Patient has no known allergies.    Review of Systems   Review of Systems  All other systems reviewed and are negative.   Physical Exam Updated Vital Signs BP (!) 144/87   Pulse 92   Temp 98.3 F (36.8 C) (Oral)   Resp 19   Ht 5\' 6"  (1.676 m)   Wt 104.3 kg   SpO2 97%   BMI 37.12 kg/m  Physical Exam Vitals and nursing note reviewed.  Constitutional:      General: She is not in acute distress.    Appearance: Normal appearance. She is well-developed.  HENT:     Head: Normocephalic.     Comments: Superficial laceration to the left forehead.  No active bleeding noted. Eyes:     Conjunctiva/sclera: Conjunctivae normal.     Pupils: Pupils are equal, round, and reactive to light.  Cardiovascular:     Rate and Rhythm: Normal rate and regular rhythm.     Heart sounds: Normal heart sounds.  Pulmonary:     Effort: Pulmonary effort is normal. No respiratory distress.     Breath sounds: Normal breath sounds.  Abdominal:     General: There is no distension.     Palpations: Abdomen is soft.     Tenderness: There is no abdominal tenderness.  Musculoskeletal:        General: No  deformity. Normal range of motion.     Cervical back: Normal range of motion and neck supple.     Comments: Tenderness to the lateral proximal left humerus.  Distal left upper extremity is neurovascular tact without appreciable tenderness overlying the left elbow, left forearm, left wrist, or left hand.   Minimal tenderness overlying the middle aspect of the left great toe.  No significant ecchymosis or edema noted.  Skin:    General: Skin is warm and dry.  Neurological:     General: No focal deficit present.     Mental Status: She is alert and oriented to person, place, and time.     ED Results / Procedures / Treatments   Labs (all labs ordered are listed, but only abnormal results are displayed) Labs Reviewed - No data to display  EKG None  Radiology DG Humerus Left  Result Date:  07/27/2022 CLINICAL DATA:  Fall.  Left humerus pain. EXAM: LEFT HUMERUS - 2+ VIEW COMPARISON:  CT cervical spine 07/27/2022 FINDINGS: Comminuted displaced fracture involving the proximal left humerus. Fracture appears to involve the left humeral neck but limited evaluation of the left humeral head region. Left shoulder appears located on these two views. Normal alignment at the left [facility] joint. Visualized left ribs appear to be intact. Concern for some irregularity along the superior aspect of the left scapula. Difficult to exclude a left scapular fracture. IMPRESSION: Comminuted displaced fracture involving the proximal left humerus. Difficult to exclude left scapular injury. Electronically Signed   By: Richarda Overlie M.D.   On: 07/27/2022 14:50   DG Toe Great Left  Result Date: 07/27/2022 CLINICAL DATA:  Fall EXAM: LEFT GREAT TOE 3 VIEWS COMPARISON:  None Available. FINDINGS: There is a mildly displaced fracture at the dorsal aspect of the great toe distal phalanx with evidence of intra-articular extension. There is mild overlying surrounding soft tissue swelling. No radiopaque foreign body. IMPRESSION: Mildly displaced fracture at the dorsal aspect of the great toe distal phalanx with intra-articular extension. Electronically Signed   By: Lorenza Cambridge M.D.   On: 07/27/2022 14:47   CT Head Wo Contrast  Result Date: 07/27/2022 CLINICAL DATA:  Head and neck trauma. Patient had mechanical fall tripped and fell. On Xarelto. EXAM: CT HEAD WITHOUT CONTRAST CT CERVICAL SPINE WITHOUT CONTRAST TECHNIQUE: Multidetector CT imaging of the head and cervical spine was performed following the standard protocol without intravenous contrast. Multiplanar CT image reconstructions of the cervical spine were also generated. RADIATION DOSE REDUCTION: This exam was performed according to the departmental dose-optimization program which includes automated exposure control, adjustment of the mA and/or kV according to patient size  and/or use of iterative reconstruction technique. COMPARISON:  None Available. FINDINGS: CT HEAD FINDINGS Brain: No evidence of acute infarction, hemorrhage, hydrocephalus, extra-axial collection or mass lesion/mass effect. Vascular: No hyperdense vessel or unexpected calcification. Skull: Normal. Negative for fracture or focal lesion. Sinuses/Orbits: No acute finding. Other: None. CT CERVICAL SPINE FINDINGS Alignment: Straightening of the cervical spine. Mild retrolisthesis of C4 and C5. Skull base and vertebrae: No acute fracture. No primary bone lesion or focal pathologic process. Soft tissues and spinal canal: No prevertebral fluid or swelling. No visible canal hematoma. Disc levels: Multilevel degenerate disc disease with disc height loss and marginal osteophytes. C2-C3:  No significant findings. C3-C4:  No significant findings. C4-C5: Disc height loss and uncovertebral joint arthropathy. Mild bilateral neural foraminal stenosis. C5-C6: Disc height loss and uncovertebral joint arthropathy with mild left and moderate right neural foraminal  stenosis. C6-C7: Disc height loss and uncovertebral joint arthropathy with mild bilateral neural foraminal stenosis. C7-T1:  No significant findings. Upper chest: Negative. Other: None IMPRESSION: CT head: 1. No acute intracranial abnormality. CT cervical spine: 1. No evidence of cervical spine fracture or traumatic subluxation. 2. Multilevel degenerative disc disease of the cervical spine, as described above. Electronically Signed   By: Larose Hires D.O.   On: 07/27/2022 13:59   CT Cervical Spine Wo Contrast  Result Date: 07/27/2022 CLINICAL DATA:  Head and neck trauma. Patient had mechanical fall tripped and fell. On Xarelto. EXAM: CT HEAD WITHOUT CONTRAST CT CERVICAL SPINE WITHOUT CONTRAST TECHNIQUE: Multidetector CT imaging of the head and cervical spine was performed following the standard protocol without intravenous contrast. Multiplanar CT image reconstructions of  the cervical spine were also generated. RADIATION DOSE REDUCTION: This exam was performed according to the departmental dose-optimization program which includes automated exposure control, adjustment of the mA and/or kV according to patient size and/or use of iterative reconstruction technique. COMPARISON:  None Available. FINDINGS: CT HEAD FINDINGS Brain: No evidence of acute infarction, hemorrhage, hydrocephalus, extra-axial collection or mass lesion/mass effect. Vascular: No hyperdense vessel or unexpected calcification. Skull: Normal. Negative for fracture or focal lesion. Sinuses/Orbits: No acute finding. Other: None. CT CERVICAL SPINE FINDINGS Alignment: Straightening of the cervical spine. Mild retrolisthesis of C4 and C5. Skull base and vertebrae: No acute fracture. No primary bone lesion or focal pathologic process. Soft tissues and spinal canal: No prevertebral fluid or swelling. No visible canal hematoma. Disc levels: Multilevel degenerate disc disease with disc height loss and marginal osteophytes. C2-C3:  No significant findings. C3-C4:  No significant findings. C4-C5: Disc height loss and uncovertebral joint arthropathy. Mild bilateral neural foraminal stenosis. C5-C6: Disc height loss and uncovertebral joint arthropathy with mild left and moderate right neural foraminal stenosis. C6-C7: Disc height loss and uncovertebral joint arthropathy with mild bilateral neural foraminal stenosis. C7-T1:  No significant findings. Upper chest: Negative. Other: None IMPRESSION: CT head: 1. No acute intracranial abnormality. CT cervical spine: 1. No evidence of cervical spine fracture or traumatic subluxation. 2. Multilevel degenerative disc disease of the cervical spine, as described above. Electronically Signed   By: Larose Hires D.O.   On: 07/27/2022 13:59    Procedures .Marland KitchenLaceration Repair  Date/Time: 07/27/2022 4:25 PM  Performed by: Wynetta Fines, MD Authorized by: Wynetta Fines, MD   Consent:     Consent obtained:  Verbal   Consent given by:  Patient   Risks, benefits, and alternatives were discussed: yes     Risks discussed:  Infection, nerve damage, need for additional repair, poor wound healing, poor cosmetic result, pain, retained foreign body, tendon damage and vascular damage Universal protocol:    Immediately prior to procedure, a time out was called: yes     Patient identity confirmed:  Verbally with patient Laceration details:    Location:  Face   Face location:  Forehead   Length (cm):  0.5 Pre-procedure details:    Preparation:  Patient was prepped and draped in usual sterile fashion and imaging obtained to evaluate for foreign bodies Exploration:    Hemostasis achieved with:  Direct pressure   Imaging outcome: foreign body not noted     Contaminated: no   Treatment:    Area cleansed with:  Povidone-iodine   Amount of cleaning:  Standard Skin repair:    Repair method:  Tissue adhesive Approximation:    Approximation:  Close Repair type:  Repair type:  Simple Post-procedure details:    Dressing:  Open (no dressing)   Procedure completion:  Tolerated     Medications Ordered in ED Medications - No data to display  ED Course/ Medical Decision Making/ A&P                             Medical Decision Making Amount and/or Complexity of Data Reviewed Radiology: ordered.  Risk Prescription drug management.    Medical Screen Complete  This patient presented to the ED with complaint of fall, head injury, left arm injury.  This complaint involves an extensive number of treatment options. The initial differential diagnosis includes, but is not limited to, trauma related to fall  This presentation is: Acute, Self-Limited, Previously Undiagnosed, Uncertain Prognosis, Complicated, Systemic Symptoms, and Threat to Life/Bodily Function  Patient presents after fall.  Patient did strike her head.  She also has a superficial laceration to the left  forehead.  She has an obvious deformity to the left humerus.  Imaging at confirms left humerus fracture and left great toe fracture.  Sling and buddy taping of toes applied.  Forehead laceration repaired without difficulty.  Tetanus up-to-date  CT imaging without significant acute abnormality.  Patient is reassured after ED evaluation.  She does understand need for close outpatient follow-up with her already established orthopedic doctor.  Strict return precautions given and understood.  Additional history obtained:  External records from outside sources obtained and reviewed including prior ED visits and prior Inpatient records.    Imaging Studies ordered:  I ordered imaging studies including CT head, CT C-spine, plain films of left humerus and left great toe I independently visualized and interpreted obtained imaging which showed left humerus fracture, left great toe fracture I agree with the radiologist interpretation.   Problem List / ED Course:  Fall, head injury, left humerus fracture, left great toe fracture   Reevaluation:  After the interventions noted above, I reevaluated the patient and found that they have: improved   Disposition:  After consideration of the diagnostic results and the patients response to treatment, I feel that the patent would benefit from close outpatient follow-up.          Final Clinical Impression(s) / ED Diagnoses Final diagnoses:  Laceration of forehead, initial encounter  Closed nondisplaced comminuted fracture of shaft of left humerus, initial encounter  Displaced unspecified fracture of left great toe, initial encounter for closed fracture    Rx / DC Orders ED Discharge Orders          Ordered    HYDROcodone-acetaminophen (NORCO/VICODIN) 5-325 MG tablet  Every 6 hours PRN        07/27/22 1509              Wynetta Fines, MD 07/27/22 [phone]

## 2022-07-27 NOTE — Discharge Instructions (Signed)
Return for any problem.   Follow-up closely with orthopedics for additional treatment of your left humerus fracture and left great toe fracture.  Dermabond applied to your laceration will fall off on its own.  Please clean the area gently to avoid disrupting the glue.

## 2022-07-30 ENCOUNTER — Other Ambulatory Visit: Payer: Self-pay

## 2022-07-30 ENCOUNTER — Encounter (HOSPITAL_COMMUNITY): Payer: Self-pay | Admitting: Orthopedic Surgery

## 2022-07-30 NOTE — Progress Notes (Addendum)
Spoke with pt for pre-op call. Pt denies cardiac history or Diabetes. Pt is treated for HTN. Pt is on Xarelto for a PE. Pt is to hold Xarelto, last dose was Thursday, 07/29/22 and she starts Lovenox injections today, Saturday and Sunday, per instructions from Dr. Aundria Rud' office.   Shower/bath instructions given to pt.

## 2022-07-30 NOTE — Progress Notes (Signed)
Anesthesia Chart Review: Same day workup  67 year old female with pertinent history including HTN, OSA on CPAP, GERD, atypical chest pain.  Patient was seen in the ED on 06/13/2022 with complaint of sharp pain into her left axilla.  She reported at that time it been going on for the past month or so.  EKG was normal.  Troponin negative x 2.  CT showed small, nonocclusive, chronic appearing clot in the right lung which was on the opposite side of her pain.  Significance is unclear, however she was started on Xarelto and recommended to follow-up with her PCP.  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  Patient was seen by her PCP in follow-up on 06/16/2022 she was referred to cardiology for further evaluation of pericardial cyst.  It was also noted by PCP that her chest pain had resolved.  She has not yet been seen by cardiology (scheduled 09/02/2022).  Of note, patient was previously evaluated for atypical chest pain in January 2015.  Exercise stress test at that time showed no evidence of inducible myocardial ischemia, EF 65%.  Echo showed normal biventricular function, normal valves.  Patient was seen in the ED on 07/27/2022 for mechanical fall and was diagnosed with left humeral fracture as well as left great toe fracture.  Patient reports last dose of Xarelto 07/29/2022 and she is on a Lovenox bridge at the direction of Dr. Aundria Rud.  Patient will need day of surgery labs and evaluation.  EKG 06/13/2022: Sinus rhythm.  Rate 73.  CTA chest 06/13/2022: IMPRESSION: 1. Nonocclusive, thin linear filling defect in a segmental branch to the posterior right upper lobe. 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 2. 3.5 cm well-defined homogeneous lesion in the anterior mediastinum shows no perceptible wall or internal architecture and measures water density. Imaging features compatible with pericardial cyst. Follow-up CT chest without contrast in 3-6  months could be used to ensure stability. 3.  Aortic Atherosclerosis (ICD10-I70.0).     Zannie Cove [facility] Short Stay Center/Anesthesiology Phone [phone] 07/30/2022 1:20 PM

## 2022-07-30 NOTE — Anesthesia Preprocedure Evaluation (Signed)
Anesthesia Evaluation  Patient identified by MRN, date of birth, ID band Patient awake    Reviewed: Allergy & Precautions, H&P , NPO status , Patient's Chart, lab work & pertinent test results  History of Anesthesia Complications (+) Family history of anesthesia reaction  Airway Mallampati: III  TM Distance: >3 FB Neck ROM: Full    Dental no notable dental hx. 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 on medications negative cardio ROS Normal cardiovascular exam Rhythm:Regular Rate:Normal     Neuro/Psych negative neurological ROS  negative psych ROS   GI/Hepatic negative GI ROS, Neg liver ROS,GERD  Medicated and Controlled,,  Endo/Other  negative endocrine ROS    Renal/GU negative Renal ROS  negative genitourinary   Musculoskeletal negative musculoskeletal ROS (+)    Abdominal   Peds negative pediatric ROS (+)  Hematology negative hematology ROS (+)   Anesthesia Other Findings   Reproductive/Obstetrics negative OB ROS                             Anesthesia Physical Anesthesia Plan  ASA: 3  Anesthesia Plan: General and Regional   Post-op Pain Management: Regional block*   Induction: Intravenous  PONV Risk Score and Plan: 3 and Ondansetron, Dexamethasone and Treatment may vary due to age or medical condition  Airway Management Planned: Oral ETT  Additional Equipment: None  Intra-op Plan:   Post-operative Plan: Extubation in OR  Informed Consent: I have reviewed the patients History and Physical, chart, labs and discussed the procedure including the risks, benefits and alternatives for the proposed  anesthesia with the patient or authorized representative who has indicated his/her understanding and acceptance.     Dental advisory given  Plan Discussed with: Anesthesiologist and CRNA  Anesthesia Plan Comments: (PAT note by Antionette Poles, PA-C: 67 year old female with pertinent history including HTN, OSA on CPAP, GERD, atypical chest pain.  Patient was seen in the ED on 06/13/2022 with complaint of sharp pain into her left axilla.  She reported at that time it been going on for the past month or so.  EKG was normal.  Troponin negative x 2.  CT showed small, nonocclusive, chronic appearing clot in the right lung which was on the opposite side of her pain.  Significance is unclear, however she was started on Xarelto and recommended to follow-up with her PCP.  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  Patient was seen by her PCP in follow-up on 06/16/2022 she was referred to cardiology for further evaluation of pericardial cyst.  It was also noted by PCP that her chest pain had resolved.  She has not yet been seen by cardiology (scheduled 09/02/2022).  Of note, patient was previously evaluated for atypical chest pain in January 2015.  Exercise stress test at that time showed no evidence of inducible myocardial ischemia, EF 65%.  Echo showed normal biventricular function, normal valves.  Patient was seen in the ED on 07/27/2022 for mechanical fall and was diagnosed with left humeral fracture as well as left great toe fracture.  Patient reports last dose of Xarelto 07/29/2022 and she is on a Lovenox bridge at the direction of Dr. Aundria Rud.  Patient will  need day of surgery labs and evaluation.  EKG 06/13/2022: Sinus rhythm.  Rate 73.  CTA chest 06/13/2022: IMPRESSION: 1. Nonocclusive, thin linear filling defect in a segmental branch to the posterior right upper lobe. 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 2. 3.5 cm well-defined homogeneous  lesion in the anterior mediastinum shows no perceptible wall or internal architecture and measures water density. Imaging features compatible with pericardial cyst. Follow-up CT chest without contrast in 3-6 months could be used to ensure stability. 3.  Aortic Atherosclerosis (ICD10-I70.0).   )        Anesthesia Quick Evaluation

## 2022-08-02 ENCOUNTER — Encounter (HOSPITAL_COMMUNITY): Payer: Self-pay | Admitting: Orthopedic Surgery

## 2022-08-02 ENCOUNTER — Other Ambulatory Visit: Payer: Self-pay

## 2022-08-02 ENCOUNTER — Ambulatory Visit (HOSPITAL_COMMUNITY): Payer: BC Managed Care – PPO | Admitting: Physician Assistant

## 2022-08-02 ENCOUNTER — Observation Stay (HOSPITAL_COMMUNITY)
Admission: RE | Admit: 2022-08-02 | Discharge: 2022-08-04 | Disposition: A | Discharge status: Home / Self Care | Payer: BC Managed Care – PPO | Attending: Orthopedic Surgery | Admitting: Orthopedic Surgery

## 2022-08-02 ENCOUNTER — Encounter (HOSPITAL_COMMUNITY)
Admission: RE | Disposition: A | Discharge status: Home / Self Care | Payer: Self-pay | Source: Home / Self Care | Attending: Orthopedic Surgery

## 2022-08-02 ENCOUNTER — Ambulatory Visit (HOSPITAL_COMMUNITY): Payer: BC Managed Care – PPO

## 2022-08-02 DIAGNOSIS — I10 Essential (primary) hypertension: Secondary | ICD-10-CM | POA: Insufficient documentation

## 2022-08-02 DIAGNOSIS — W1830XA Fall on same level, unspecified, initial encounter: Secondary | ICD-10-CM | POA: Insufficient documentation

## 2022-08-02 DIAGNOSIS — S42352A Displaced comminuted fracture of shaft of humerus, left arm, initial encounter for closed fracture: Secondary | ICD-10-CM | POA: Diagnosis present

## 2022-08-02 DIAGNOSIS — Z7901 Long term (current) use of anticoagulants: Secondary | ICD-10-CM | POA: Diagnosis not present

## 2022-08-02 DIAGNOSIS — Z79899 Other long term (current) drug therapy: Secondary | ICD-10-CM | POA: Diagnosis not present

## 2022-08-02 DIAGNOSIS — S42202A Unspecified fracture of upper end of left humerus, initial encounter for closed fracture: Secondary | ICD-10-CM | POA: Diagnosis present

## 2022-08-02 HISTORY — DX: Family history of other specified conditions: Z84.89

## 2022-08-02 HISTORY — DX: Gastro-esophageal reflux disease without esophagitis: K21.9

## 2022-08-02 HISTORY — DX: Pneumonia, unspecified organism: J18.9

## 2022-08-02 HISTORY — DX: Other pulmonary embolism without acute cor pulmonale: I26.99

## 2022-08-02 HISTORY — PX: ORIF HUMERUS FRACTURE: SHX2126

## 2022-08-02 HISTORY — DX: Sleep apnea, unspecified: G47.30

## 2022-08-02 LAB — CBC
HCT: 22.8 % — ABNORMAL LOW (ref 36.0–46.0)
Hemoglobin: 6.9 g/dL — CL (ref 12.0–15.0)
MCH: 26.7 pg (ref 26.0–34.0)
MCHC: 30.3 g/dL (ref 30.0–36.0)
MCV: 88.4 fL (ref 80.0–100.0)
Platelets: 158 10*3/uL (ref 150–400)
RBC: 2.58 MIL/uL — ABNORMAL LOW (ref 3.87–5.11)
RDW: 14.8 % (ref 11.5–15.5)
WBC: 10.3 10*3/uL (ref 4.0–10.5)
nRBC: 0 % (ref 0.0–0.2)

## 2022-08-02 LAB — BASIC METABOLIC PANEL
Anion gap: 16 — ABNORMAL HIGH (ref 5–15)
BUN: 8 mg/dL (ref 8–23)
CO2: 16 mmol/L — ABNORMAL LOW (ref 22–32)
Calcium: 8.1 mg/dL — ABNORMAL LOW (ref 8.9–10.3)
Chloride: 104 mmol/L (ref 98–111)
Creatinine, Ser: 0.32 mg/dL — ABNORMAL LOW (ref 0.44–1.00)
GFR, Estimated: >60 mL/min (ref >60)
Glucose, Bld: 84 mg/dL (ref 70–99)
Potassium: 4.1 mmol/L (ref 3.5–5.1)
Sodium: 136 mmol/L (ref 135–145)

## 2022-08-02 LAB — TYPE AND SCREEN: ABO/RH(D): A POS

## 2022-08-02 LAB — ABO/RH: ABO/RH(D): A POS

## 2022-08-02 LAB — PREPARE RBC (CROSSMATCH)

## 2022-08-02 SURGERY — OPEN REDUCTION INTERNAL FIXATION (ORIF) PROXIMAL HUMERUS FRACTURE
Anesthesia: Regional | Laterality: Left

## 2022-08-02 MED ORDER — METOCLOPRAMIDE HCL 5 MG PO TABS: 5.0000 mg | ORAL_TABLET | Freq: Three times a day (TID) | ORAL | Status: DC | PRN

## 2022-08-02 MED ORDER — ACETAMINOPHEN 500 MG PO TABS
500.0000 mg | ORAL_TABLET | Freq: Four times a day (QID) | ORAL | Status: AC
  Administered 2022-08-02 – 2022-08-03 (×2): 500 mg via ORAL
  Filled 2022-08-02 (×2): qty 1

## 2022-08-02 MED ORDER — RIVAROXABAN 20 MG PO TABS
20.0000 mg | ORAL_TABLET | Freq: Every day | ORAL | Status: DC
  Administered 2022-08-03: 20 mg via ORAL
  Filled 2022-08-02: qty 1

## 2022-08-02 MED ORDER — PHENYLEPHRINE HCL (PRESSORS) 10 MG/ML IV SOLN
INTRAVENOUS | Status: DC | PRN
  Administered 2022-08-02 (×2): 80 ug via INTRAVENOUS

## 2022-08-02 MED ORDER — ORAL CARE MOUTH RINSE: 15.0000 mL | Freq: Once | OROMUCOSAL | Status: AC

## 2022-08-02 MED ORDER — METOCLOPRAMIDE HCL 5 MG/ML IJ SOLN: 5.0000 mg | Freq: Three times a day (TID) | INTRAMUSCULAR | Status: DC | PRN

## 2022-08-02 MED ORDER — SUGAMMADEX SODIUM 200 MG/2ML IV SOLN
INTRAVENOUS | Status: DC | PRN
  Administered 2022-08-02: 200 mg via INTRAVENOUS

## 2022-08-02 MED ORDER — CEFAZOLIN SODIUM-DEXTROSE 2-4 GM/100ML-% IV SOLN
INTRAVENOUS | Status: AC
  Filled 2022-08-02: qty 100

## 2022-08-02 MED ORDER — MENTHOL 3 MG MT LOZG: 1.0000 | LOZENGE | OROMUCOSAL | Status: DC | PRN

## 2022-08-02 MED ORDER — SODIUM CHLORIDE 0.9% IV SOLUTION: Freq: Once | INTRAVENOUS | Status: DC

## 2022-08-02 MED ORDER — FENTANYL CITRATE (PF) 100 MCG/2ML IJ SOLN: 25.0000 ug | INTRAMUSCULAR | Status: DC | PRN

## 2022-08-02 MED ORDER — ONDANSETRON HCL 4 MG/2ML IJ SOLN
INTRAMUSCULAR | Status: AC
  Filled 2022-08-02: qty 2

## 2022-08-02 MED ORDER — BUPIVACAINE LIPOSOME 1.3 % IJ SUSP
INTRAMUSCULAR | Status: AC
  Filled 2022-08-02: qty 10

## 2022-08-02 MED ORDER — BUPIVACAINE HCL (PF) 0.5 % IJ SOLN
INTRAMUSCULAR | Status: DC | PRN
  Administered 2022-08-02: 10 mL via PERINEURAL

## 2022-08-02 MED ORDER — CHLORHEXIDINE GLUCONATE 0.12 % MT SOLN: 15.0000 mL | Freq: Once | OROMUCOSAL | Status: AC

## 2022-08-02 MED ORDER — FENTANYL CITRATE (PF) 250 MCG/5ML IJ SOLN
INTRAMUSCULAR | Status: AC
  Filled 2022-08-02: qty 5

## 2022-08-02 MED ORDER — PROPOFOL 10 MG/ML IV BOLUS
INTRAVENOUS | Status: DC | PRN
  Administered 2022-08-02: 200 mg via INTRAVENOUS

## 2022-08-02 MED ORDER — ONDANSETRON HCL 4 MG/2ML IJ SOLN
INTRAMUSCULAR | Status: DC | PRN
  Administered 2022-08-02: 4 mg via INTRAVENOUS

## 2022-08-02 MED ORDER — PROMETHAZINE HCL 25 MG/ML IJ SOLN: 6.2500 mg | INTRAMUSCULAR | Status: DC | PRN

## 2022-08-02 MED ORDER — CEFAZOLIN SODIUM-DEXTROSE 2-4 GM/100ML-% IV SOLN
2.0000 g | Freq: Four times a day (QID) | INTRAVENOUS | Status: DC
  Administered 2022-08-03: 2 g via INTRAVENOUS
  Filled 2022-08-02: qty 100

## 2022-08-02 MED ORDER — MORPHINE SULFATE (PF) 2 MG/ML IV SOLN: 0.5000 mg | INTRAVENOUS | Status: DC | PRN

## 2022-08-02 MED ORDER — CEFAZOLIN SODIUM-DEXTROSE 2-4 GM/100ML-% IV SOLN
2.0000 g | INTRAVENOUS | Status: AC
  Administered 2022-08-02: 2 g via INTRAVENOUS

## 2022-08-02 MED ORDER — PHENOL 1.4 % MT LIQD: 1.0000 | OROMUCOSAL | Status: DC | PRN

## 2022-08-02 MED ORDER — AMISULPRIDE (ANTIEMETIC) 5 MG/2ML IV SOLN: 10.0000 mg | Freq: Once | INTRAVENOUS | Status: DC | PRN

## 2022-08-02 MED ORDER — MIDAZOLAM HCL 2 MG/2ML IJ SOLN
INTRAMUSCULAR | Status: DC | PRN
  Administered 2022-08-02: 1 mg via INTRAVENOUS

## 2022-08-02 MED ORDER — MIDAZOLAM HCL 2 MG/2ML IJ SOLN
INTRAMUSCULAR | Status: AC
  Filled 2022-08-02: qty 2

## 2022-08-02 MED ORDER — ATORVASTATIN CALCIUM 40 MG PO TABS
40.0000 mg | ORAL_TABLET | Freq: Every evening | ORAL | Status: DC
  Administered 2022-08-02 – 2022-08-03 (×2): 40 mg via ORAL
  Filled 2022-08-02 (×2): qty 1

## 2022-08-02 MED ORDER — TRANEXAMIC ACID-NACL 1000-0.7 MG/100ML-% IV SOLN: 1000.0000 mg | INTRAVENOUS | Status: DC

## 2022-08-02 MED ORDER — HYDROCODONE-ACETAMINOPHEN 7.5-325 MG PO TABS
1.0000 | ORAL_TABLET | ORAL | Status: DC | PRN
  Administered 2022-08-03: 2 via ORAL
  Filled 2022-08-02: qty 2

## 2022-08-02 MED ORDER — TRANEXAMIC ACID-NACL 1000-0.7 MG/100ML-% IV SOLN
1000.0000 mg | Freq: Once | INTRAVENOUS | Status: AC
  Administered 2022-08-02: 1000 mg via INTRAVENOUS
  Filled 2022-08-02: qty 100

## 2022-08-02 MED ORDER — CHLORHEXIDINE GLUCONATE 0.12 % MT SOLN
OROMUCOSAL | Status: AC
  Administered 2022-08-02: 15 mL via OROMUCOSAL
  Filled 2022-08-02: qty 15

## 2022-08-02 MED ORDER — ACETAMINOPHEN 325 MG PO TABS: 325.0000 mg | ORAL_TABLET | Freq: Four times a day (QID) | ORAL | Status: DC | PRN

## 2022-08-02 MED ORDER — BUPIVACAINE LIPOSOME 1.3 % IJ SUSP
INTRAMUSCULAR | Status: DC | PRN
  Administered 2022-08-02: 10 mL via PERINEURAL

## 2022-08-02 MED ORDER — TRANEXAMIC ACID-NACL 1000-0.7 MG/100ML-% IV SOLN
INTRAVENOUS | Status: AC
  Filled 2022-08-02: qty 100

## 2022-08-02 MED ORDER — ONDANSETRON HCL 4 MG/2ML IJ SOLN: 4.0000 mg | Freq: Four times a day (QID) | INTRAMUSCULAR | Status: DC | PRN

## 2022-08-02 MED ORDER — DEXAMETHASONE SODIUM PHOSPHATE 10 MG/ML IJ SOLN
INTRAMUSCULAR | Status: AC
  Filled 2022-08-02: qty 1

## 2022-08-02 MED ORDER — BENAZEPRIL HCL 20 MG PO TABS
20.0000 mg | ORAL_TABLET | Freq: Every evening | ORAL | Status: DC
  Administered 2022-08-02 – 2022-08-03 (×2): 20 mg via ORAL
  Filled 2022-08-02 (×2): qty 1

## 2022-08-02 MED ORDER — LACTATED RINGERS IV SOLN: INTRAVENOUS | Status: DC

## 2022-08-02 MED ORDER — BENAZEPRIL-HYDROCHLOROTHIAZIDE 20-12.5 MG PO TABS: 1.0000 | ORAL_TABLET | Freq: Every evening | ORAL | Status: DC

## 2022-08-02 MED ORDER — FENTANYL CITRATE (PF) 100 MCG/2ML IJ SOLN
INTRAMUSCULAR | Status: AC
  Administered 2022-08-02: 100 ug
  Filled 2022-08-02: qty 2

## 2022-08-02 MED ORDER — FENTANYL CITRATE (PF) 100 MCG/2ML IJ SOLN
INTRAMUSCULAR | Status: DC | PRN
  Administered 2022-08-02: 50 ug via INTRAVENOUS

## 2022-08-02 MED ORDER — HYDROCODONE-ACETAMINOPHEN 5-325 MG PO TABS: 1.0000 | ORAL_TABLET | ORAL | Status: DC | PRN

## 2022-08-02 MED ORDER — MIDAZOLAM HCL 2 MG/2ML IJ SOLN
INTRAMUSCULAR | Status: AC
  Administered 2022-08-02: 2 mg
  Filled 2022-08-02: qty 2

## 2022-08-02 MED ORDER — DEXAMETHASONE SODIUM PHOSPHATE 10 MG/ML IJ SOLN
INTRAMUSCULAR | Status: DC | PRN
  Administered 2022-08-02: 10 mg via INTRAVENOUS

## 2022-08-02 MED ORDER — CALCIUM CARBONATE ANTACID 500 MG PO CHEW: 2.0000 | CHEWABLE_TABLET | Freq: Every day | ORAL | Status: DC | PRN

## 2022-08-02 MED ORDER — ONDANSETRON HCL 4 MG PO TABS: 4.0000 mg | ORAL_TABLET | Freq: Four times a day (QID) | ORAL | Status: DC | PRN

## 2022-08-02 MED ORDER — PANTOPRAZOLE SODIUM 40 MG PO TBEC
40.0000 mg | DELAYED_RELEASE_TABLET | Freq: Every day | ORAL | Status: DC
  Administered 2022-08-02 – 2022-08-04 (×3): 40 mg via ORAL
  Filled 2022-08-02 (×3): qty 1

## 2022-08-02 MED ORDER — ROCURONIUM BROMIDE 10 MG/ML (PF) SYRINGE
PREFILLED_SYRINGE | INTRAVENOUS | Status: DC | PRN
  Administered 2022-08-02: 70 mg via INTRAVENOUS

## 2022-08-02 MED ORDER — HYDROCHLOROTHIAZIDE 12.5 MG PO TABS
12.5000 mg | ORAL_TABLET | Freq: Every evening | ORAL | Status: DC
  Administered 2022-08-02 – 2022-08-03 (×2): 12.5 mg via ORAL
  Filled 2022-08-02 (×2): qty 1

## 2022-08-02 MED ORDER — DOCUSATE SODIUM 100 MG PO CAPS
100.0000 mg | ORAL_CAPSULE | Freq: Two times a day (BID) | ORAL | Status: DC
  Administered 2022-08-03 – 2022-08-04 (×3): 100 mg via ORAL
  Filled 2022-08-02 (×3): qty 1

## 2022-08-02 SURGICAL SUPPLY — 61 items
BAG COUNTER SPONGE SURGICOUNT (BAG) ×2 IMPLANT
BAG SPNG CNTER NS LX DISP (BAG) ×1
BIT DRILL 3.8X270 (BIT) IMPLANT
BIT DRILL SHORT 3.2MM (DRILL) IMPLANT
COVER SURGICAL LIGHT HANDLE (MISCELLANEOUS) ×2 IMPLANT
DRAPE C-ARM 42X72 X-RAY (DRAPES) IMPLANT
DRAPE INCISE IOBAN 66X45 STRL (DRAPES) ×2 IMPLANT
DRAPE ORTHO SPLIT 77X108 STRL (DRAPES) ×2
DRAPE SURG ORHT 6 SPLT 77X108 (DRAPES) ×4 IMPLANT
DRAPE U-SHAPE 47X51 STRL (DRAPES) ×2 IMPLANT
DRILL SHORT 3.2MM (DRILL) ×1
DRSG ADAPTIC 3X8 NADH LF (GAUZE/BANDAGES/DRESSINGS) IMPLANT
DRSG AQUACEL AG ADV 3.5X 4 (GAUZE/BANDAGES/DRESSINGS) IMPLANT
DRSG AQUACEL AG ADV 3.5X 6 (GAUZE/BANDAGES/DRESSINGS) IMPLANT
DURAPREP 26ML APPLICATOR (WOUND CARE) ×2 IMPLANT
ELECT REM PT RETURN 9FT ADLT (ELECTROSURGICAL) ×1
ELECTRODE REM PT RTRN 9FT ADLT (ELECTROSURGICAL) ×2 IMPLANT
GAUZE PAD ABD 8X10 STRL (GAUZE/BANDAGES/DRESSINGS) ×2 IMPLANT
GAUZE SPONGE 4X4 12PLY STRL (GAUZE/BANDAGES/DRESSINGS) IMPLANT
GLOVE BIO SURGEON STRL SZ7.5 (GLOVE) ×4 IMPLANT
GLOVE BIOGEL PI IND STRL 8 (GLOVE) ×4 IMPLANT
GOWN STRL REUS W/ TWL LRG LVL3 (GOWN DISPOSABLE) ×2 IMPLANT
GOWN STRL REUS W/ TWL XL LVL3 (GOWN DISPOSABLE) ×4 IMPLANT
GOWN STRL REUS W/TWL LRG LVL3 (GOWN DISPOSABLE) ×1
GOWN STRL REUS W/TWL XL LVL3 (GOWN DISPOSABLE) ×2
K-WIRE TROCAR POINT 2.5X280 (WIRE) ×1
KIT BASIN OR (CUSTOM PROCEDURE TRAY) ×2 IMPLANT
KIT TURNOVER KIT B (KITS) ×2 IMPLANT
KWIRE TROCAR POINT 2.5X280 (WIRE) IMPLANT
MANIFOLD NEPTUNE II (INSTRUMENTS) ×2 IMPLANT
NAIL HUM MULTILOC 7X270 LG (Nail) IMPLANT
NS IRRIG 1000ML POUR BTL (IV SOLUTION) ×2 IMPLANT
PACK SHOULDER (CUSTOM PROCEDURE TRAY) ×2 IMPLANT
PAD ARMBOARD 7.5X6 YLW CONV (MISCELLANEOUS) ×4 IMPLANT
ROD REAMING 2.5 (INSTRUMENTS) IMPLANT
SCREW LOCK 4.5X40 (Screw) ×1 IMPLANT
SCREW LOCK 4X26 TI (Screw) IMPLANT
SCREW LOCK 4X36 TI (Screw) IMPLANT
SCREW LOCK FT 40X4.5X3.9XSLF (Screw) IMPLANT
SCREW LOCK THRD TI ML 4.5X34 (Screw) IMPLANT
SCREW MULTILOC FT 4.5X28 NS (Screw) IMPLANT
SLING ARM FOAM STRAP LRG (SOFTGOODS) IMPLANT
SLING ARM FOAM STRAP MED (SOFTGOODS) IMPLANT
SPONGE T-LAP 18X18 ~~LOC~~+RFID (SPONGE) IMPLANT
SPONGE T-LAP 4X18 ~~LOC~~+RFID (SPONGE) IMPLANT
STAPLER VISISTAT 35W (STAPLE) IMPLANT
STRIP CLOSURE SKIN 1/2X4 (GAUZE/BANDAGES/DRESSINGS) IMPLANT
SUCTION FRAZIER HANDLE 10FR (MISCELLANEOUS) ×1
SUCTION TUBE FRAZIER 10FR DISP (MISCELLANEOUS) ×2 IMPLANT
SUT FIBERWIRE #2 38 T-5 BLUE (SUTURE)
SUT MNCRL AB 4-0 PS2 18 (SUTURE) IMPLANT
SUT VIC AB 0 CT1 27 (SUTURE) ×2
SUT VIC AB 0 CT1 27XBRD ANBCTR (SUTURE) IMPLANT
SUT VIC AB 2-0 CT1 27 (SUTURE) ×2
SUT VIC AB 2-0 CT1 TAPERPNT 27 (SUTURE) ×2 IMPLANT
SUTURE FIBERWR #2 38 T-5 BLUE (SUTURE) IMPLANT
SYR CONTROL 10ML LL (SYRINGE) IMPLANT
TOWEL GREEN STERILE (TOWEL DISPOSABLE) ×2 IMPLANT
TOWEL GREEN STERILE FF (TOWEL DISPOSABLE) ×2 IMPLANT
WATER STERILE IRR 1000ML POUR (IV SOLUTION) ×2 IMPLANT
YANKAUER SUCT BULB TIP NO VENT (SUCTIONS) ×2 IMPLANT

## 2022-08-02 NOTE — Anesthesia Procedure Notes (Signed)
Anesthesia Regional Block: Interscalene brachial plexus block   Pre-Anesthetic Checklist: , timeout performed,  Correct Patient, Correct Site, Correct Laterality,  Correct Procedure, Correct Position, site marked,  Risks and benefits discussed,  Surgical consent,  Pre-op evaluation,  At surgeon's request and post-op pain management  Laterality: Left  Prep: chloraprep       Needles:  Injection technique: Single-shot  Needle Type: Echogenic Stimulator Needle     Needle Length: 5cm  Needle Gauge: 22     Additional Needles:   Procedures:, nerve stimulator,,, ultrasound used (permanent image in chart),,     Nerve Stimulator or Paresthesia:  Response: hand, 0.45 mA  Additional Responses:   Narrative:  Start time: 08/02/2022 3:05 PM End time: 08/02/2022 3:10 PM Injection made incrementally with aspirations every 5 mL.  Performed by: Personally  Anesthesiologist: Bethena Midget, MD  Additional Notes: Functioning IV was confirmed and monitors were applied.  A 50mm 22ga Arrow echogenic stimulator needle was used. Sterile prep and drape,hand hygiene and sterile gloves were used. Ultrasound guidance: relevant anatomy identified, needle position confirmed, local anesthetic spread visualized around nerve(s)., vascular puncture avoided.  Image printed for medical record. Negative aspiration and negative test dose prior to incremental administration of local anesthetic. The patient tolerated the procedure well.

## 2022-08-02 NOTE — Transfer of Care (Signed)
Immediate Anesthesia Transfer of Care Note  Patient: Renee Bennett  Procedure(s) Performed: OPEN REDUCTION INTERNAL FIXATION (ORIF) PROXIMAL HUMERUS FRACTURE (Left)  Patient Location: PACU  Anesthesia Type:General  Level of Consciousness: awake, alert , and oriented  Airway & Oxygen Therapy: Patient Spontanous Breathing  Post-op Assessment: Report given to RN and Post -op Vital signs reviewed and stable  Post vital signs: Reviewed and stable  Last Vitals:  Vitals Value Taken Time  BP    Temp    Pulse    Resp    SpO2      Last Pain:  Vitals:   08/02/22 1424  TempSrc:   PainSc: 0-No pain         Complications: There were no known notable events for this encounter.

## 2022-08-02 NOTE — H&P (Signed)
ORTHOPAEDIC H&P  REQUESTING PHYSICIAN: Yolonda Kida, MD  PCP:  Ollen Bowl, MD  Chief Complaint: Left humerus fracture  HPI: Renee Bennett is a 67 y.o. female who complains of left shoulder and upper arm pain following a ground-level fall back on 16 April.  Here today for open reduction internal fixation.  No new complaints today.  She has been compliant with sling.  She is dealing with significant swelling and is on Xarelto at baseline for recently diagnosed PE.  She is currently bridged with Lovenox.  No anticoagulation today.  Past Medical History:  Diagnosis Date   Chest pain    Family history of adverse reaction to anesthesia    sister slow to wake up   GERD (gastroesophageal reflux disease)    Hypercholesterolemia    Hypertension    Pneumonia    Pulmonary embolus    Sleep apnea    uses cpap   Past Surgical History:  Procedure Laterality Date   ABDOMINAL HYSTERECTOMY     partial   COLONOSCOPY     TONSILLECTOMY     Social History   Socioeconomic History   Marital status: Single    Spouse name: Not on file   Number of children: Not on file   Years of education: Not on file   Highest education level: Not on file  Occupational History   Not on file  Tobacco Use   Smoking status: Never    Passive exposure: Past   Smokeless tobacco: Never  Vaping Use   Vaping Use: Never used  Substance and Sexual Activity   Alcohol use: No   Drug use: No   Sexual activity: Not on file  Other Topics Concern   Not on file  Social History Narrative   Not on file   Social Determinants of Health   Financial Resource Strain: Not on file  Food Insecurity: Not on file  Transportation Needs: Not on file  Physical Activity: Not on file  Stress: Not on file  Social Connections: Not on file   Family History  Problem Relation Age of Onset   Heart disease Mother    Hypertension Father    Hyperlipidemia Father    Diabetes Father    Breast cancer Sister     Hypertension Sister    Hypertension Sister    Diabetes Maternal Grandmother    Heart disease Paternal Grandmother    Heart disease Paternal Grandfather    Allergies  Allergen Reactions   Simvastatin Other (See Comments)    Muscle pain/knee pain.   Prior to Admission medications   Medication Sig Start Date End Date Taking? Authorizing Provider  atorvastatin (LIPITOR) 40 MG tablet Take 40 mg by mouth every evening.   Yes [provider]  benazepril-hydrochlorthiazide (LOTENSIN HCT) 20-12.5 MG per tablet Take 1 tablet by mouth every evening.   Yes [provider]  calcium carbonate (TUMS - DOSED IN MG ELEMENTAL CALCIUM) 500 MG chewable tablet Chew 2 tablets by mouth daily as needed for indigestion or heartburn.   Yes [provider]  guaiFENesin (MUCINEX) 600 MG 12 hr tablet Take 600 mg by mouth 2 (two) times daily as needed (congestion/cough).   Yes [provider]  HYDROcodone-acetaminophen (NORCO/VICODIN) 5-325 MG tablet Take 1 tablet by mouth every 6 (six) hours as needed. 07/27/22  Yes Wynetta Fines, MD  omeprazole (PRILOSEC) 20 MG capsule Take 20 mg by mouth 2 (two) times daily as needed (indigestion/heartburn.).   Yes [provider]  rivaroxaban (XARELTO) 20 MG TABS tablet Take 20 mg by mouth in the morning.   Yes [provider]  RIVAROXABAN Carlena Hurl) VTE STARTER PACK (15 & 20 MG) Follow package directions: Take one  tablet by mouth twice a day. On day 22, switch to one  tablet once a day. Take with food. Patient not taking: Reported on 07/30/2022 06/13/22   Horton, Mayer Masker, MD   No results found.  Positive ROS: All other systems have been reviewed and were otherwise negative with the exception of those mentioned in the HPI and as above.  Physical Exam: General: Alert, no acute distress Cardiovascular: No pedal edema Respiratory: No cyanosis, no use of accessory musculature GI: No organomegaly, abdomen is soft and  non-tender Skin: No lesions in the area of chief complaint Neurologic: Sensation intact distally Psychiatric: Patient is competent for consent with normal mood and affect Lymphatic: No axillary or cervical lymphadenopathy  MUSCULOSKELETAL:  Left upper extremity is warm and well-perfused.  Moderate swelling throughout the arm.  Tenderness at the shoulder nontender at the elbow forearm and wrist.  Neurovascular intact.*Decree sensation superficial radial nerve distribution.  Assessment: Left displaced spiral proximal humerus fracture  Plan: Plan to proceed with intramedullary nail and open reduction for the left humerus.  We again discussed the risk and benefits of the procedure in detail.  ``The risks, benefits, and alternatives were discussed with the patient. There are risks associated with the surgery including, but not limited to, problems with anesthesia (death), infection, differences in leg length/angulation/rotation, fracture of bones, loosening or failure of implants, malunion, nonunion, hematoma (blood accumulation) which may require surgical drainage, blood clots, pulmonary embolism, nerve injury (foot drop), and blood vessel injury. The patient understands these risks and elects to proceed.  ` Plan to admit postoperatively for some work with OT and PT for hopeful discharge home tomorrow afternoon.    Yolonda Kida, MD Cell [phone]    08/02/2022 1:18 PM

## 2022-08-02 NOTE — Op Note (Signed)
Date of Surgery: 08/02/2022  INDICATIONS: Renee Bennett is a 67 y.o.-year-old female with a left proximal humerus long spiral fracture with comminution following a ground-level fall.;  The patient did consent to the procedure after discussion of the risks and benefits.  PREOPERATIVE DIAGNOSIS:  1.  Left proximal humerus spiral fracture with comminution  POSTOPERATIVE DIAGNOSIS: Same.  PROCEDURE: Left humerus intramedullary nail  SURGEON: Maryan Rued, M.D.  ASSIST: Dion Saucier, PA-C  Assistant attestation:  PA Mcclung was present for the entire procedure..  ANESTHESIA:  general, interscalene block with Exparel  IV FLUIDS AND URINE: See anesthesia.  ESTIMATED BLOOD LOSS: 50 mL.  IMPLANTS:  Synthes 7 mm x 270 mm humeral nail 3 proximal locking screws 1 distal interlocking screw  DRAINS: None  COMPLICATIONS: None.  DESCRIPTION OF PROCEDURE: The patient was brought to the operating room and placed sloppy supine with a bump under the scapula on the operating table.  The patient had been signed prior to the procedure and this was documented. The patient had the anesthesia placed by the anesthesiologist.  A time-out was performed to confirm that this was the correct patient, site, side and location. The patient did receive antibiotics prior to the incision and was re-dosed during the procedure as needed at indicated intervals.  A tourniquet was not placed.  The patient had the operative extremity prepped and draped in the standard surgical fashion.     We began the procedure by percutaneously establishing our opening reamer starting point.  Just anterior to the [facility] joint we found a position at the apex of the humeral head on AP and lateral views.  We then confirmed this with a starting pin.  A percutaneous incision was made through the anterior deltoid and bluntly dissected down to the level of the muscular portion of the rotator cuff superiorly.  This was then split in line with muscle  fibers using a tonsil.  We then were able to place in all over our starting pin that was previously placed in the center center position of the proximal humeral head and metaphysis.  The awl was taken down to the level of the metaphysis.  This allowed [place] to enter with our guidewire.  A slight bed was placed and a guidewire and we then were able to advance the guidewire down to the distal humerus.  We were able to do this in a closed fashion.  There was a large proximal butterfly fragment that did kick into some valgus but we were able to percutaneously keep this reduced during reaming.  We then reamed up to a size 8 reamer to accommodate a 7 nail.  We measured the nail length to be 270 mm.  Next we placed our nail over the guidewire.  We malleted this into place to the appropriate depth based on AP intraoperative fluoroscopy.  We then placed 3 proximal locking screws through the hand jig that locked nicely into the lateral cortex.  These had good control of the humerus.  We then viewed under live fluoroscopy to confirm that no screws entered the joint.  Lastly, we turned our attention to the distal interlocking screw.  Utilizing perfect circle technique we identified the proximal of the most distal cluster of screw holes.  Percutaneously we then placed a drill bit across the anterior to posterior cortex.  This was confirmed on AP and lateral fluoroscopic images.  We then measured with a measuring guide.  We then placed our distal locking bolt which measured 26  mm.  We then copiously irrigated wounds with normal saline.  We closed in layers proximal and distal wounds with 2-0 Vicryl and staples for the skin.  The arm was cleaned and dried and standard sterile dressings were applied.  We then placed the arm in a sling.  She was awakened from general anesthetic in stable condition.  All counts were correct x 2.  POSTOPERATIVE PLAN:  Ms. Rhinehart will be okay to use the left upper extremity immediately for  activities of daily living.  No active elevation above 90 degrees for the first 2 weeks.  She can do full active hand and wrist range of motion as tolerated.  Otherwise will be in the sling for comfort.  We will admit her to the orthopedics floor for postoperative occupational therapy and pain control.  She does live alone and will be cautious before discharging home, and make sure she is okay to be independent.  Otherwise, I will see her in the office in 2 weeks with AP and lateral x-rays of the left humerus.

## 2022-08-02 NOTE — Brief Op Note (Signed)
08/02/2022  5:18 PM  PATIENT:  Renee Bennett  67 y.o. female  PRE-OPERATIVE DIAGNOSIS:  Left proximal humerus fracture  POST-OPERATIVE DIAGNOSIS:  Left proximal humerus fracture  PROCEDURE:  Procedure(s) with comments: OPEN REDUCTION INTERNAL FIXATION (ORIF) PROXIMAL HUMERUS FRACTURE (Left) - 120  SURGEON:  Surgeon(s) and Role:    * Aundria Rud, Noah Delaine, MD - Primary  PHYSICIAN ASSISTANT: Dion Saucier, PA-C   ANESTHESIA:   regional and general  EBL:  50 mL   BLOOD ADMINISTERED:none  DRAINS: none   LOCAL MEDICATIONS USED:  NONE  SPECIMEN:  No Specimen  DISPOSITION OF SPECIMEN:  N/A  COUNTS:  YES  TOURNIQUET:  * No tourniquets in log *  DICTATION: .Note written in EPIC  PLAN OF CARE: Admit to inpatient   PATIENT DISPOSITION:  PACU - hemodynamically stable.   Delay start of Pharmacological VTE agent (>24hrs) due to surgical blood loss or risk of bleeding: not applicable

## 2022-08-02 NOTE — Anesthesia Postprocedure Evaluation (Signed)
Anesthesia Post Note  Patient: Renee Bennett  Procedure(s) Performed: OPEN REDUCTION INTERNAL FIXATION (ORIF) PROXIMAL HUMERUS FRACTURE (Left)     Patient location during evaluation: PACU Anesthesia Type: Regional and General Level of consciousness: sedated Pain management: pain level controlled Vital Signs Assessment: post-procedure vital signs reviewed and stable Respiratory status: spontaneous breathing and respiratory function stable Cardiovascular status: stable Postop Assessment: no apparent nausea or vomiting Anesthetic complications: no   There were no known notable events for this encounter.  Last Vitals:  Vitals:   08/02/22 1800 08/02/22 1837  BP: (!) 170/79 (!) 185/78  Pulse: 93 96  Resp: (!) 27 (!) 21  Temp:  36.6 C  SpO2: 95% 97%    Last Pain:  Vitals:   08/02/22 1837  TempSrc: Oral  PainSc: 1                  [name] DANIEL

## 2022-08-02 NOTE — Progress Notes (Signed)
ANTICOAGULATION CONSULT NOTE - Initial Consult  Pharmacy Consult for xarelto Indication: DVT  Allergies  Allergen Reactions   Simvastatin Other (See Comments)    Muscle pain/knee pain.    Patient Measurements: Height:  (167.6 cm) Weight: 104.3 kg (230 lb) IBW/kg (Calculated) : 59.3   Vital Signs: Temp: 97.8 F (36.6 C) (04/22 1837) Temp Source: Oral (04/22 1837) BP: 185/78 (04/22 1837) Pulse Rate: 96 (04/22 1837)  Labs: No results for input(s): "HGB", "HCT", "PLT", "APTT", "LABPROT", "INR", "HEPARINUNFRC", "HEPRLOWMOCWT", "CREATININE", "CKTOTAL", "CKMB", "TROPONINIHS" in the last 72 hours.  CrCl cannot be calculated (Patient's most recent lab result is older than the maximum 21 days allowed.).   Medical History: Past Medical History:  Diagnosis Date   Chest pain    Family history of adverse reaction to anesthesia    sister slow to wake up   GERD (gastroesophageal reflux disease)    Hypercholesterolemia    Hypertension    Pneumonia    Pulmonary embolus    Sleep apnea    uses cpap    Medications:  Medications Prior to Admission  Medication Sig Dispense Refill Last Dose   atorvastatin (LIPITOR) 40 MG tablet Take 40 mg by mouth every evening.   08/01/2022   benazepril-hydrochlorthiazide (LOTENSIN HCT) 20-12.5 MG per tablet Take 1 tablet by mouth every evening.   08/01/2022   calcium carbonate (TUMS - DOSED IN MG ELEMENTAL CALCIUM) 500 MG chewable tablet Chew 2 tablets by mouth daily as needed for indigestion or heartburn.      guaiFENesin (MUCINEX) 600 MG 12 hr tablet Take 600 mg by mouth 2 (two) times daily as needed (congestion/cough).      HYDROcodone-acetaminophen (NORCO/VICODIN) 5-325 MG tablet Take 1 tablet by mouth every 6 (six) hours as needed. 10 tablet 0    omeprazole (PRILOSEC) 20 MG capsule Take 20 mg by mouth 2 (two) times daily as needed (indigestion/heartburn.).   08/01/2022   rivaroxaban (XARELTO) 20 MG TABS tablet Take 20 mg by mouth in the  morning.   07/29/2022   RIVAROXABAN (XARELTO) VTE STARTER PACK (15 & 20 MG) Follow package directions: Take one  tablet by mouth twice a day. On day 22, switch to one  tablet once a day. Take with food. (Patient not taking: Reported on 07/30/2022) 51 each 0 Not Taking    Assessment: 45 YOF s/p left humerus intramedullary nailing requiring continuation of home Xarelto. Consult placed  Goal of Therapy:  Monitor platelets by anticoagulation protocol: Yes   Plan:  Restart Xarelto 20 mg po qsupper on 08/03/2022 (POD#1) x 60 days. She has already completed her starter pack doses.  Per consult, it is okay to start therapy POD#1 Pharmacy will sign off on consult but continue to monitor making recommendations prn Thank you   Greta Doom BS, PharmD, BCPS Clinical Pharmacist 08/02/2022 6:49 PM  Contact: [phone] after 3 PM  "Be curious, not judgmental..." -Debbora Dus

## 2022-08-02 NOTE — Anesthesia Procedure Notes (Signed)
Procedure Name: Intubation Date/Time: 08/02/2022 3:35 PM  Performed by: Sharyn Dross, CRNAPre-anesthesia Checklist: Patient identified, Emergency Drugs available, Suction available and Patient being monitored Patient Re-evaluated:Patient Re-evaluated prior to induction Oxygen Delivery Method: Circle system utilized Preoxygenation: Pre-oxygenation with 100% oxygen Induction Type: IV induction Ventilation: Mask ventilation without difficulty Laryngoscope Size: Mac and 4 Grade View: Grade I Tube type: Oral Tube size: 7.0 mm Number of attempts: 1 Airway Equipment and Method: Stylet and Oral airway Placement Confirmation: ETT inserted through vocal cords under direct vision, positive ETCO2 and breath sounds checked- equal and bilateral Secured at: 22 cm Tube secured with: Tape Dental Injury: Teeth and Oropharynx as per pre-operative assessment

## 2022-08-02 NOTE — Discharge Instructions (Signed)
Orthopedic surgery discharge instructions:  -Maintain postoperative bandage until follow-up appointment.  This is waterproof, and you may begin showering on postoperative day #3.  Do not submerge underwater.  Maintain that bandage until your follow-up appointment in 2 weeks.  -No lifting over 2 pounds with operateive arm.  You may use the arm immediately for activities of daily living such as bathing, washing your face and brushing your teeth, eating, and getting dressed.  Otherwise maintain your sling when you are out of the house and sleeping.  -Apply ice liberally to the shoulder throughout the day.  For mild to moderate pain use Tylenol and Advil as needed around-the-clock.  For breakthrough pain use oxycodone as necessary.  -You will return to see Dr. Aundria Rud in the office in 2 weeks for routine postoperative check with x-rays. Call for an appointment.

## 2022-08-03 DIAGNOSIS — S42352A Displaced comminuted fracture of shaft of humerus, left arm, initial encounter for closed fracture: Secondary | ICD-10-CM | POA: Diagnosis not present

## 2022-08-03 LAB — CBC
HCT: 33.4 % — ABNORMAL LOW (ref 36.0–46.0)
Hemoglobin: 11.1 g/dL — ABNORMAL LOW (ref 12.0–15.0)
MCH: 27.5 pg (ref 26.0–34.0)
MCHC: 33.2 g/dL (ref 30.0–36.0)
MCV: 82.7 fL (ref 80.0–100.0)
Platelets: 270 10*3/uL (ref 150–400)
RBC: 4.04 MIL/uL (ref 3.87–5.11)
RDW: 14.8 % (ref 11.5–15.5)
WBC: 14.8 10*3/uL — ABNORMAL HIGH (ref 4.0–10.5)
nRBC: 0 % (ref 0.0–0.2)

## 2022-08-03 LAB — TYPE AND SCREEN
Antibody Screen: NEGATIVE
Unit division: 0

## 2022-08-03 LAB — POCT I-STAT, CHEM 8
BUN: 12 mg/dL (ref 8–23)
Calcium, Ion: 1.28 mmol/L (ref 1.15–1.40)
Chloride: 103 mmol/L (ref 98–111)
Creatinine, Ser: 0.5 mg/dL (ref 0.44–1.00)
Glucose, Bld: 104 mg/dL — ABNORMAL HIGH (ref 70–99)
HCT: 34 % — ABNORMAL LOW (ref 36.0–46.0)
Hemoglobin: 11.6 g/dL — ABNORMAL LOW (ref 12.0–15.0)
Potassium: 3.8 mmol/L (ref 3.5–5.1)
Sodium: 139 mmol/L (ref 135–145)
TCO2: 31 mmol/L (ref 22–32)

## 2022-08-03 LAB — BASIC METABOLIC PANEL
Anion gap: 8 (ref 5–15)
BUN: 12 mg/dL (ref 8–23)
CO2: 25 mmol/L (ref 22–32)
Calcium: 9.5 mg/dL (ref 8.9–10.3)
Chloride: 105 mmol/L (ref 98–111)
Creatinine, Ser: 0.57 mg/dL (ref 0.44–1.00)
GFR, Estimated: >60 mL/min (ref >60)
Glucose, Bld: 130 mg/dL — ABNORMAL HIGH (ref 70–99)
Potassium: 4.2 mmol/L (ref 3.5–5.1)
Sodium: 138 mmol/L (ref 135–145)

## 2022-08-03 LAB — BPAM RBC
Blood Product Expiration Date: 202405092359
ISSUE DATE / TIME: 202404222253
Unit Type and Rh: 6200

## 2022-08-03 MED ORDER — CEFAZOLIN SODIUM-DEXTROSE 2-4 GM/100ML-% IV SOLN
2.0000 g | Freq: Once | INTRAVENOUS | Status: AC
  Administered 2022-08-03: 2 g via INTRAVENOUS
  Filled 2022-08-03: qty 100

## 2022-08-03 NOTE — Plan of Care (Signed)
  Problem: Education: Goal: Knowledge of the prescribed therapeutic regimen will improve Outcome: Progressing   Problem: Education: Goal: Knowledge of General Education information will improve Description: Including pain rating scale, medication(s)/side effects and non-pharmacologic comfort measures Outcome: Progressing   Problem: Elimination: Goal: Will not experience complications related to urinary retention Outcome: Progressing   Problem: Pain Managment: Goal: General experience of comfort will improve Outcome: Progressing   Problem: Safety: Goal: Ability to remain free from injury will improve Outcome: Progressing   Problem: Skin Integrity: Goal: Risk for impaired skin integrity will decrease Outcome: Progressing

## 2022-08-03 NOTE — Evaluation (Signed)
Occupational Therapy Evaluation Patient Details Name: Renee Bennett MRN: [id number] DOB: 1955-11-12 Today's Date: 08/03/2022   History of Present Illness 67 y/o female s/p Left ORIF proximal humerus fracture.   Clinical Impression   Patient evaluated by Occupational Therapy with no further acute OT needs identified. All education has been completed and the patient has no further questions. Provided pt with verbal education and handout of HEP with verbal instruction and visual demonstration. Recommend that patient follow up with physician's recommended therapies. No equipment needs. OT is signing off. Thank you for this referral.       Recommendations for follow up therapy are one component of a multi-disciplinary discharge planning process, led by the attending physician.  Recommendations may be updated based on patient status, additional functional criteria and insurance authorization.   Assistance Recommended at Discharge PRN  Patient can return home with the following A little help with walking and/or transfers;A little help with bathing/dressing/bathroom;Assistance with cooking/housework;Assist for transportation    Functional Status Assessment  Patient has had a recent decline in their functional status and demonstrates the ability to make significant improvements in function in a reasonable and predictable amount of time.  Equipment Recommendations  None recommended by OT       Precautions / Restrictions Precautions Precautions: Shoulder Type of Shoulder Precautions: post-op shoulder protocol. Ok to use LUE for light ADL tasks. P/ROM and A/ROM of shoulder, elbow, wrist, and hand ok. Sling on for comfort and sleep. LUE NWB. Shoulder Interventions: Shoulder sling/immobilizer;For comfort (and sleep) Precaution Booklet Issued: Yes (comment) Precaution Comments: Verbal education provided regarding shoulder precautions. Restrictions Weight Bearing Restrictions: Yes LUE Weight  Bearing: Non weight bearing Other Position/Activity Restrictions: P/ROM & A/ROM: shoulder flexion 0-90, abduction: 0-60, er: 0-30      Mobility Bed Mobility Overal bed mobility: Modified Independent      Patient Response: Cooperative  Transfers Overall transfer level: Needs assistance Equipment used: None Transfers: Sit to/from Stand, Bed to chair/wheelchair/BSC Sit to Stand: Supervision     Step pivot transfers: Supervision     General transfer comment: Provided SBA for IV pole management only      Balance Overall balance assessment: No apparent balance deficits (not formally assessed)      ADL either performed or assessed with clinical judgement   ADL Overall ADL's : Needs assistance/impaired    General ADL Comments: Able to complete toileting, grooming (hand washing), and functional transfers at Mod I level. Min A provided for UB dressing and sling management, and LB ADL.     Vision Baseline Vision/History: 0 No visual deficits Ability to See in Adequate Light: 0 Adequate Patient Visual Report: No change from baseline Vision Assessment?: No apparent visual deficits            Pertinent Vitals/Pain Pain Assessment Pain Assessment: 0-10 Pain Score: 0-No pain     Hand Dominance Right   Extremity/Trunk Assessment Upper Extremity Assessment Upper Extremity Assessment: LUE deficits/detail;RUE deficits/detail RUE Deficits / Details: Overall WFL for tasks assessed. LUE Deficits / Details: Able to demonstrate functional hand and wrist A/ROM. Limited elbow A/ROM although  able to tolerate slightly more range passively. Post surgical swelling noted in LUE. LUE: Unable to fully assess due to immobilization   Lower Extremity Assessment Lower Extremity Assessment: Overall WFL for tasks assessed   Cervical / Trunk Assessment Cervical / Trunk Assessment: Other exceptions Cervical / Trunk Exceptions: body habitus   Communication Communication Communication: No  difficulties   Cognition Arousal/Alertness: Awake/alert Behavior During  Therapy: WFL for tasks assessed/performed Overall Cognitive Status: Within Functional Limits for tasks assessed                     Home Living Family/patient expects to be discharged to:: Private residence Living Arrangements: Alone Available Help at Discharge: Family;Friend(s);Available PRN/intermittently Type of Home: House Home Access: Stairs to enter [employer] of Steps: 3- front Entrance Stairs-Rails: Left;Right;Can reach both Home Layout: One level     Bathroom Shower/Tub: Producer, television/film/video: Standard     Home Equipment: None          Prior Functioning/Environment Prior Level of Function : Independent/Modified Independent;Working/employed      Mobility Comments: independent ADLs Comments: 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        OT Problem List: Decreased strength;Impaired UE functional use      OT Treatment/Interventions:   Self-care home management   OT Goals(Current goals can be found in the care plan section) Acute Rehab OT Goals Patient Stated Goal: to stay one more night before going home  OT Frequency:  1X visit       AM-PAC OT "6 Clicks" Daily Activity     Outcome Measure Help from another person eating meals?: None Help from another person taking care of personal grooming?: None Help from another person toileting, which includes using toliet, bedpan, or urinal?: None Help from another person bathing (including washing, rinsing, drying)?: A Little Help from another person to put on and taking off regular upper body clothing?: A Little Help from another person to put on and taking off regular lower body clothing?: A Little 6 Click Score: 21   End of Session Equipment Utilized During Treatment: Other (comment) (sling)  Activity Tolerance: Patient tolerated treatment well Patient left: in chair;with call bell/phone within  reach;with chair alarm set;with nursing/sitter in room  OT Visit Diagnosis: Muscle weakness (generalized) (M62.81)                Time: 1610-9604 OT Time Calculation (min): 25 min Charges:  OT General Charges $OT Visit: 1 Visit OT Evaluation $OT Eval Moderate Complexity: 1 Mod OT Treatments $Self Care/Home Management : 8-22 mins  Limmie Sariyah, OTR/L,CBIS  Supplemental OT - [facility] and [facility] Secure Chat Preferred    Deshara Rossi, Charisse March 08/03/2022, 11:43 AM

## 2022-08-03 NOTE — Progress Notes (Signed)
Pt has home CPAP/BIPAP unit setup at bedside. Machine and power cords checked for damages. No damage noted. Pt is able to manage when ready. Pt understands to let RN know if they have questions for RT.  

## 2022-08-03 NOTE — Progress Notes (Signed)
   Subjective:  Renee Bennett is a 67 y.o. female, 1 Day Post-Op    s/p Procedure(s): OPEN REDUCTION INTERNAL FIXATION (ORIF) PROXIMAL HUMERUS FRACTURE   Patient reports pain as mild to moderate.  Reports still some tingling in the fingers.  Reports pain is well-controlled.  She did have her OT visit, did well with this.  Objective:   VITALS:   Vitals:   08/02/22 2305 08/02/22 2320 08/03/22 0206 08/03/22 0700  BP: (!) 132/55 (!) 143/64 (!) 124/52 119/69  Pulse: (!) 107 (!) 104 96 81  Resp: Temp: 99.6 F (37.6 C) 98.3 F (36.8 C) 98.5 F (36.9 C) 97.7 F (36.5 C)  TempSrc: Oral Oral Oral Oral  SpO2: 100% 98% 96% 97%  Weight:      Height:        General : NAD, In hospital chair LUE:  Ioban dressing present on left upper arm without significant drainage.  Intact passive elbow motion.  Able to move digits with decreased sensation in the fingers secondary to block.  Mild to mod swelling of the arm.  Able to extend the wrist. Radial pulse 2+ Lab Results  Component Value Date   WBC 14.8 (H) 08/03/2022   HGB 11.1 (L) 08/03/2022   HCT 33.4 (L) 08/03/2022   MCV 82.7 08/03/2022   PLT 270 08/03/2022   BMET    Component Value Date/Time   NA 138 08/03/2022 0608   K 4.2 08/03/2022 0608   CL 105 08/03/2022 0608   CO2 25 08/03/2022 0608   GLUCOSE 130 (H) 08/03/2022 0608   BUN 12 08/03/2022 0608   CREATININE 0.57 08/03/2022 0608   CALCIUM 9.5 08/03/2022 0608   GFRNONAA >60 08/03/2022 0608     Assessment/Plan: 1 Day Post-Op   Principal Problem:   Closed fracture of left proximal humerus   Advance diet Up with therapy  S/p blood administration, HGB stable at 11.1.   Dispo: Plan for discharge home tomorrow after checking labs after restarting Xarelto.   Weightbearing Status: NWB LUE, ok for elbow, wrist, digit ROM, shoulder ROM up to 90, sling for comfort.  DVT Prophylaxis: Xarelto - (on for PE tx)    Arbie Cookey 08/03/2022, 11:17  AM  Dion Saucier PA-C  Physician Assistant with Dr. Rebekah [place]

## 2022-08-03 NOTE — Plan of Care (Signed)

## 2022-08-04 DIAGNOSIS — S42352A Displaced comminuted fracture of shaft of humerus, left arm, initial encounter for closed fracture: Secondary | ICD-10-CM | POA: Diagnosis not present

## 2022-08-04 LAB — CBC
HCT: 32.4 % — ABNORMAL LOW (ref 36.0–46.0)
Hemoglobin: 10.2 g/dL — ABNORMAL LOW (ref 12.0–15.0)
MCH: 26.9 pg (ref 26.0–34.0)
MCHC: 31.5 g/dL (ref 30.0–36.0)
MCV: 85.5 fL (ref 80.0–100.0)
Platelets: 280 10*3/uL (ref 150–400)
RBC: 3.79 MIL/uL — ABNORMAL LOW (ref 3.87–5.11)
RDW: 15.4 % (ref 11.5–15.5)
WBC: 13.2 10*3/uL — ABNORMAL HIGH (ref 4.0–10.5)
nRBC: 0.2 % (ref 0.0–0.2)

## 2022-08-04 MED ORDER — METHOCARBAMOL 500 MG PO TABS: 500.0000 mg | ORAL_TABLET | Freq: Four times a day (QID) | ORAL | 0 refills | Status: AC | PRN

## 2022-08-04 MED ORDER — HYDROCODONE-ACETAMINOPHEN 5-325 MG PO TABS: 1.0000 | ORAL_TABLET | ORAL | 0 refills | Status: AC | PRN

## 2022-08-04 NOTE — Progress Notes (Signed)
Discharge instructions given. Patient verbalized understanding and all questions were answered.  ?

## 2022-08-04 NOTE — Plan of Care (Signed)
  Problem: Pain Managment: Goal: General experience of comfort will improve Outcome: Progressing   Problem: Safety: Goal: Ability to remain free from injury will improve Outcome: Progressing   Problem: Skin Integrity: Goal: Risk for impaired skin integrity will decrease Outcome: Progressing   

## 2022-08-04 NOTE — TOC Transition Note (Addendum)
Transition of Care [facility]) - CM/SW Discharge Note   Patient Details  Name: BRECKLYNN JIAN MRN: [id number] Date of Birth: [birth year]  [facility] of [facility] CM/SW Contact:  Leone Haven, RN Phone Number: 08/04/2022, 10:07 AM   Clinical Narrative:    Patient is for dc today, no toc consult, no needs, Per staff RN patient informed her she has all the DME she needs at home.         Patient Goals and CMS Choice      Discharge Placement                         Discharge Plan and Services Additional resources added to the After Visit Summary for                                       Social Determinants of Health (SDOH) Interventions SDOH Screenings   Tobacco Use: Low Risk  (08/02/2022)     Readmission Risk Interventions     No data to display

## 2022-08-04 NOTE — Discharge Summary (Signed)
Patient ID: Renee Bennett MRN: [id number] DOB/AGE: [birth year] 67 y.o.  Admit date: 08/02/2022 Discharge date: 08/04/22  Primary Diagnosis: Left proximal humerus shaft fracture Admission Diagnoses: s/p left humerus IM Nail Past Medical History:  Diagnosis Date   Chest pain    Family history of adverse reaction to anesthesia    sister slow to wake up   GERD (gastroesophageal reflux disease)    Hypercholesterolemia    Hypertension    Pneumonia    Pulmonary embolus    Sleep apnea    uses cpap   Discharge Diagnoses:   Principal Problem:   Closed fracture of left proximal humerus  Estimated body mass index is 37.12 kg/m as calculated from the following:   Height as of this encounter: 5\' 6"  (1.676 m).   Weight as of this encounter: 104.3 kg.  Procedure:  Procedure(s) (LRB): OPEN REDUCTION INTERNAL FIXATION (ORIF) PROXIMAL HUMERUS FRACTURE (Left)   Consults: None  HPI: Renee Bennett Is a 67 year old female presented to the emergency room for a left proximal humerus fracture.  She was then seen in outpatient setting for follow-up.  1 options were discussed and it was determined that she was for a left humerus IM nail for fracture stabilization and healing.  She presented to the operating room on 08/02/2022 was admitted overnight for postoperative monitoring, OT.   Laboratory Data: Admission on 08/02/2022  Component Date Value Ref Range Status   Sodium 08/02/2022 136  135 - 145 mmol/L Final   Potassium 08/02/2022 4.1  3.5 - 5.1 mmol/L Final   Chloride 08/02/2022 104  98 - 111 mmol/L Final   CO2 08/02/2022 16 (L)  22 - 32 mmol/L Final   Glucose, Bld 08/02/2022 84  70 - 99 mg/dL Final   Glucose reference range applies only to samples taken after fasting for at least 8 hours.   BUN 08/02/2022 8  8 - 23 mg/dL Final   Creatinine, Ser 08/02/2022 0.32 (L)  0.44 - 1.00 mg/dL Final   Calcium 13/24/4010 8.1 (L)  8.9 - 10.3 mg/dL Final   GFR, Estimated 08/02/2022 >60  >60  mL/min Final   Comment: (NOTE) Calculated using the CKD-EPI Creatinine Equation (2021)    Anion gap 08/02/2022 16 (H)  5 - 15 Final   Performed at [facility], [address], [place], [place] [address]   WBC 08/02/2022 10.3  4.0 - 10.5 K/uL Final   RBC 08/02/2022 2.58 (L)  3.87 - 5.11 MIL/uL Final   Hemoglobin 08/02/2022 6.9 (LL)  12.0 - 15.0 g/dL Final   Comment: REPEATED TO VERIFY THIS CRITICAL RESULT HAS VERIFIED AND BEEN CALLED TO Kathie Rhodes WHITEHORN, RN BY DAISY BLU ON 04 22 2024 AT 1938, AND HAS BEEN READ BACK.     HCT 08/02/2022 22.8 (L)  36.0 - 46.0 % Final   MCV 08/02/2022 88.4  80.0 - 100.0 fL Final   MCH 08/02/2022 26.7  26.0 - 34.0 pg Final   MCHC 08/02/2022 30.3  30.0 - 36.0 g/dL Final   RDW 66/44/0347 14.8  11.5 - 15.5 % Final   Platelets 08/02/2022 158  150 - 400 K/uL Final   REPEATED TO VERIFY   nRBC 08/02/2022 0.0  0.0 - 0.2 % Final   Performed at [facility], [address], [place], [place] [address]   WBC 08/03/2022 14.8 (H)  4.0 - 10.5 K/uL Final   RBC 08/03/2022 4.04  3.87 - 5.11 MIL/uL Final   Hemoglobin 08/03/2022 11.1 (L)  12.0 - 15.0 g/dL Final   Comment: REPEATED TO VERIFY POST TRANSFUSION SPECIMEN    HCT 08/03/2022 33.4 (L)  36.0 - 46.0 % Final   MCV 08/03/2022 82.7  80.0 - 100.0 fL Final   MCH 08/03/2022 27.5  26.0 - 34.0 pg Final   MCHC 08/03/2022 33.2  30.0 - 36.0 g/dL Final   RDW 16/01/9603 14.8  11.5 - 15.5 % Final   Platelets 08/03/2022 270  150 - 400 K/uL Final   nRBC 08/03/2022 0.0  0.0 - 0.2 % Final   Performed at [facility], [address] [place], [place] [address]   Sodium 08/03/2022 138  135 - 145 mmol/L Final   Potassium 08/03/2022 4.2  3.5 - 5.1 mmol/L Final   Chloride 08/03/2022 105  98 - 111 mmol/L Final   CO2 08/03/2022 25  22 - 32 mmol/L Final   Glucose, Bld 08/03/2022 130 (H)  70 - 99 mg/dL Final   Glucose reference range applies only to samples taken after fasting for at least 8 hours.   BUN 08/03/2022 12  8 -  23 mg/dL Final   Creatinine, Ser 08/03/2022 0.57  0.44 - 1.00 mg/dL Final   Calcium 11/91/4782 9.5  8.9 - 10.3 mg/dL Final   GFR, Estimated 08/03/2022 >60  >60 mL/min Final   Comment: (NOTE) Calculated using the CKD-EPI Creatinine Equation (2021)    Anion gap 08/03/2022 8  5 - 15 Final   Performed at [facility], [address] [place], [place] [address]   Order Confirmation 08/02/2022    Final                   Value:ORDER PROCESSED BY BLOOD BANK Performed at [facility], [address], [place], [place] [address]    ABO/RH(D) 08/02/2022 A POS   Final   Antibody Screen 08/02/2022 NEG   Final   Sample Expiration 08/02/2022 08/05/2022,2359   Final   Unit Number 08/02/2022 [id number]   Final   Blood Component Type 08/02/2022 RED CELLS,LR   Final   Unit division 08/02/2022 00   Final   Status of Unit 08/02/2022 [facility]   Final   Transfusion Status 08/02/2022 OK TO TRANSFUSE   Final   Crossmatch Result 08/02/2022    Final                   Value:Compatible Performed at [facility], [address], [place], [place] [address]    ABO/RH(D) 08/02/2022    Final                   Value:A POS Performed at [facility], [address], [place], [place] [address]    ISSUE DATE / TIME 08/02/2022 [id number]   Final   Blood Product Unit Number 08/02/2022 [id number]   Final   PRODUCT CODE 08/02/2022 [id number]   Final   Unit Type and Rh 08/02/2022 6200   Final   Blood Product Expiration Date 08/02/2022 [id number]   Final   Sodium 08/02/2022 139  135 - 145 mmol/L Final   Potassium 08/02/2022 3.8  3.5 - 5.1 mmol/L Final   Chloride 08/02/2022 103  98 - 111 mmol/L Final   BUN 08/02/2022 12  8 - 23 mg/dL Final   Creatinine, Ser 08/02/2022 0.50  0.44 - 1.00 mg/dL Final   Glucose, Bld 01/60/1093 104 (H)  70 - 99 mg/dL Final   Glucose reference range applies  only to samples taken after fasting for at least 8 hours.   Calcium, Ion 08/02/2022 1.28  1.15 - 1.40  mmol/L Final   TCO2 08/02/2022 31  22 - 32 mmol/L Final   Hemoglobin 08/02/2022 11.6 (L)  12.0 - 15.0 g/dL Final   HCT 16/01/9603 34.0 (L)  36.0 - 46.0 % Final   WBC 08/04/2022 13.2 (H)  4.0 - 10.5 K/uL Final   RBC 08/04/2022 3.79 (L)  3.87 - 5.11 MIL/uL Final   Hemoglobin 08/04/2022 10.2 (L)  12.0 - 15.0 g/dL Final   HCT 54/12/8117 32.4 (L)  36.0 - 46.0 % Final   MCV 08/04/2022 85.5  80.0 - 100.0 fL Final   MCH 08/04/2022 26.9  26.0 - 34.0 pg Final   MCHC 08/04/2022 31.5  30.0 - 36.0 g/dL Final   RDW 14/78/2956 15.4  11.5 - 15.5 % Final   Platelets 08/04/2022 280  150 - 400 K/uL Final   nRBC 08/04/2022 0.2  0.0 - 0.2 % Final   Performed at [facility], [address], [place], [place] [address]  Admission on 06/13/2022, Discharged on 06/13/2022  Component Date Value Ref Range Status   WBC 06/13/2022 9.5  4.0 - 10.5 K/uL Final   RBC 06/13/2022 4.85  3.87 - 5.11 MIL/uL Final   Hemoglobin 06/13/2022 13.1  12.0 - 15.0 g/dL Final   HCT 65/78/4696 40.8  36.0 - 46.0 % Final   MCV 06/13/2022 84.1  80.0 - 100.0 fL Final   MCH 06/13/2022 27.0  26.0 - 34.0 pg Final   MCHC 06/13/2022 32.1  30.0 - 36.0 g/dL Final   RDW 29/52/8413 14.6  11.5 - 15.5 % Final   Platelets 06/13/2022 223  150 - 400 K/uL Final   nRBC 06/13/2022 0.0  0.0 - 0.2 % Final   Neutrophils Relative % 06/13/2022 63  % Final   Neutro Abs 06/13/2022 6.0  1.7 - 7.7 K/uL Final   Lymphocytes Relative 06/13/2022 27  % Final   Lymphs Abs 06/13/2022 2.5  0.7 - 4.0 K/uL Final   Monocytes Relative 06/13/2022 6  % Final   Monocytes Absolute 06/13/2022 0.6  0.1 - 1.0 K/uL Final   Eosinophils Relative 06/13/2022 3  % Final   Eosinophils Absolute 06/13/2022 0.3  0.0 - 0.5 K/uL Final   Basophils Relative 06/13/2022 1  % Final   Basophils Absolute 06/13/2022 0.1  0.0 - 0.1 K/uL Final   Immature Granulocytes 06/13/2022 0  % Final   Abs Immature Granulocytes 06/13/2022 0.04  0.00 - 0.07 K/uL Final   Performed at [facility], [address], [place], [place] [address]   Sodium 06/13/2022 140  135 - 145 mmol/L Final   Potassium 06/13/2022 3.7  3.5 - 5.1 mmol/L Final   Chloride 06/13/2022 107  98 - 111 mmol/L Final   CO2 06/13/2022 25  22 - 32 mmol/L Final   Glucose, Bld 06/13/2022 93  70 - 99 mg/dL Final   Glucose reference range applies only to samples taken after fasting for at least 8 hours.   BUN 06/13/2022 18  8 - 23 mg/dL Final   Creatinine, Ser 06/13/2022 0.68  0.44 - 1.00 mg/dL Final   Calcium 02/72/5366 9.5  8.9 - 10.3 mg/dL Final   GFR, Estimated 06/13/2022 >60  >60 mL/min Final   Comment: (NOTE) Calculated using the CKD-EPI Creatinine Equation (2021)    Anion gap 06/13/2022 8  5 - 15 Final   Performed at  [facility], [address], [place], [place] [address]   Troponin I (High Sensitivity) 06/13/2022 6  <18 ng/L Final   Comment: (NOTE) Elevated high sensitivity troponin I (hsTnI) values and significant  changes across serial measurements may suggest ACS but many other  chronic and acute conditions are known to elevate hsTnI results.  Refer to the "Links" section for chest pain algorithms and additional  guidance. Performed at [facility], [address], [place], [place] [address]    D-Dimer, Quant 06/13/2022 1.99 (H)  0.00 - 0.50 ug/mL-FEU Final   Comment: (NOTE) At the manufacturer cut-off value of 0.5 g/mL FEU, this assay has a negative predictive value of 95-100%.This assay is intended for use in conjunction with a clinical pretest probability (PTP) assessment model to exclude pulmonary embolism (PE) and deep venous thrombosis (DVT) in outpatients suspected of PE or DVT. Results should be correlated with clinical presentation. Performed at [facility], [address], [place], [place] [address]    Troponin I (High Sensitivity) 06/13/2022 5  <18 ng/L Final   Comment: (NOTE) Elevated high sensitivity troponin I (hsTnI) values and significant  changes across serial  measurements may suggest ACS but many other  chronic and acute conditions are known to elevate hsTnI results.  Refer to the "Links" section for chest pain algorithms and additional  guidance. Performed at [facility], [address] [place], [place] [address]      X-Rays:DG Humerus Left  Result Date: 08/02/2022 CLINICAL DATA:  Elective surgery. EXAM: LEFT HUMERUS - 2+ VIEW COMPARISON:  Preoperative imaging FINDINGS: Five fluoroscopic spot views of the left humerus obtained in the operating room. Intramedullary nail with proximal and distal locking screw fixation of numeral shaft fracture. Fluoroscopy time 2 minutes 49 seconds. Dose 30.47 mGy. IMPRESSION: Intraoperative fluoroscopy during left humerus ORIF. Electronically Signed   By: Narda Rutherford M.D.   On: 08/02/2022 18:14   DG C-Arm 1-60 Min-No Report  Result Date: 08/02/2022 Fluoroscopy was utilized by the requesting physician.  No radiographic interpretation.   DG C-Arm 1-60 Min-No Report  Result Date: 08/02/2022 Fluoroscopy was utilized by the requesting physician.  No radiographic interpretation.   DG Humerus Left  Result Date: 07/27/2022 CLINICAL DATA:  Fall.  Left humerus pain. EXAM: LEFT HUMERUS - 2+ VIEW COMPARISON:  CT cervical spine 07/27/2022 FINDINGS: Comminuted displaced fracture involving the proximal left humerus. Fracture appears to involve the left humeral neck but limited evaluation of the left humeral head region. Left shoulder appears located on these two views. Normal alignment at the left [facility] joint. Visualized left ribs appear to be intact. Concern for some irregularity along the superior aspect of the left scapula. Difficult to exclude a left scapular fracture. IMPRESSION: Comminuted displaced fracture involving the proximal left humerus. Difficult to exclude left scapular injury. Electronically Signed   By: Richarda Overlie M.D.   On: 07/27/2022 14:50   DG Toe Great Left  Result Date: 07/27/2022 CLINICAL  DATA:  Fall EXAM: LEFT GREAT TOE 3 VIEWS COMPARISON:  None Available. FINDINGS: There is a mildly displaced fracture at the dorsal aspect of the great toe distal phalanx with evidence of intra-articular extension. There is mild overlying surrounding soft tissue swelling. No radiopaque foreign body. IMPRESSION: Mildly displaced fracture at the dorsal aspect of the great toe distal phalanx with intra-articular extension. Electronically Signed   By: Lorenza Cambridge M.D.   On: 07/27/2022 14:47   CT Head Wo Contrast  Result  Date: 07/27/2022 CLINICAL DATA:  Head and neck trauma. Patient had mechanical fall tripped and fell. On Xarelto. EXAM: CT HEAD WITHOUT CONTRAST CT CERVICAL SPINE WITHOUT CONTRAST TECHNIQUE: Multidetector CT imaging of the head and cervical spine was performed following the standard protocol without intravenous contrast. Multiplanar CT image reconstructions of the cervical spine were also generated. RADIATION DOSE REDUCTION: This exam was performed according to the departmental dose-optimization program which includes automated exposure control, adjustment of the mA and/or kV according to patient size and/or use of iterative reconstruction technique. COMPARISON:  None Available. FINDINGS: CT HEAD FINDINGS Brain: No evidence of acute infarction, hemorrhage, hydrocephalus, extra-axial collection or mass lesion/mass effect. Vascular: No hyperdense vessel or unexpected calcification. Skull: Normal. Negative for fracture or focal lesion. Sinuses/Orbits: No acute finding. Other: None. CT CERVICAL SPINE FINDINGS Alignment: Straightening of the cervical spine. Mild retrolisthesis of C4 and C5. Skull base and vertebrae: No acute fracture. No primary bone lesion or focal pathologic process. Soft tissues and spinal canal: No prevertebral fluid or swelling. No visible canal hematoma. Disc levels: Multilevel degenerate disc disease with disc height loss and marginal osteophytes. C2-C3:  No significant findings.  C3-C4:  No significant findings. C4-C5: Disc height loss and uncovertebral joint arthropathy. Mild bilateral neural foraminal stenosis. C5-C6: Disc height loss and uncovertebral joint arthropathy with mild left and moderate right neural foraminal stenosis. C6-C7: Disc height loss and uncovertebral joint arthropathy with mild bilateral neural foraminal stenosis. C7-T1:  No significant findings. Upper chest: Negative. Other: None IMPRESSION: CT head: 1. No acute intracranial abnormality. CT cervical spine: 1. No evidence of cervical spine fracture or traumatic subluxation. 2. Multilevel degenerative disc disease of the cervical spine, as described above. Electronically Signed   By: Larose Hires D.O.   On: 07/27/2022 13:59   CT Cervical Spine Wo Contrast  Result Date: 07/27/2022 CLINICAL DATA:  Head and neck trauma. Patient had mechanical fall tripped and fell. On Xarelto. EXAM: CT HEAD WITHOUT CONTRAST CT CERVICAL SPINE WITHOUT CONTRAST TECHNIQUE: Multidetector CT imaging of the head and cervical spine was performed following the standard protocol without intravenous contrast. Multiplanar CT image reconstructions of the cervical spine were also generated. RADIATION DOSE REDUCTION: This exam was performed according to the departmental dose-optimization program which includes automated exposure control, adjustment of the mA and/or kV according to patient size and/or use of iterative reconstruction technique. COMPARISON:  None Available. FINDINGS: CT HEAD FINDINGS Brain: No evidence of acute infarction, hemorrhage, hydrocephalus, extra-axial collection or mass lesion/mass effect. Vascular: No hyperdense vessel or unexpected calcification. Skull: Normal. Negative for fracture or focal lesion. Sinuses/Orbits: No acute finding. Other: None. CT CERVICAL SPINE FINDINGS Alignment: Straightening of the cervical spine. Mild retrolisthesis of C4 and C5. Skull base and vertebrae: No acute fracture. No primary bone lesion or  focal pathologic process. Soft tissues and spinal canal: No prevertebral fluid or swelling. No visible canal hematoma. Disc levels: Multilevel degenerate disc disease with disc height loss and marginal osteophytes. C2-C3:  No significant findings. C3-C4:  No significant findings. C4-C5: Disc height loss and uncovertebral joint arthropathy. Mild bilateral neural foraminal stenosis. C5-C6: Disc height loss and uncovertebral joint arthropathy with mild left and moderate right neural foraminal stenosis. C6-C7: Disc height loss and uncovertebral joint arthropathy with mild bilateral neural foraminal stenosis. C7-T1:  No significant findings. Upper chest: Negative. Other: None IMPRESSION: CT head: 1. No acute intracranial abnormality. CT cervical spine: 1. No evidence of cervical spine fracture or traumatic subluxation. 2. Multilevel degenerative disc disease of the  cervical spine, as described above. Electronically Signed   By: Larose Hires D.O.   On: 07/27/2022 13:59    EKG: Orders placed or performed during the hospital encounter of 06/13/22   EKG 12-Lead   EKG 12-Lead     Hospital Course: Renee Bennett is a 67 y.o. who was admitted to Hospital. They were brought to the operating room on 08/02/2022 and underwent Procedure(s): OPEN REDUCTION INTERNAL FIXATION (ORIF) PROXIMAL HUMERUS FRACTURE.  Patient tolerated the procedure well and was later transferred to the recovery room and then to the orthopaedic floor for postoperative care.  They were given PO and IV analgesics for pain control following their surgery.  They were given 24 hours of postoperative antibiotics of  Anti-infectives (From admission, onward)    Start     Dose/Rate Route Frequency Ordered Stop   08/03/22 1000  ceFAZolin (ANCEF) IVPB 2g/100 mL premix        2 g 200 mL/hr over 30 Minutes Intravenous  Once 08/03/22 0906 08/03/22 1005   08/03/22 0600  ceFAZolin (ANCEF) IVPB 2g/100 mL premix        2 g 200 mL/hr over 30 Minutes  Intravenous On call to O.R. 08/02/22 1341 08/02/22 1607   08/02/22 2200  ceFAZolin (ANCEF) IVPB 2g/100 mL premix  Status:  Discontinued        2 g 200 mL/hr over 30 Minutes Intravenous Every 6 hours 08/02/22 1841 08/03/22 0905   08/02/22 1348  ceFAZolin (ANCEF) 2-4 GM/100ML-% IVPB       Note to Pharmacy: Glenna Fellows: cabinet override      08/02/22 1348 08/02/22 1551      and started on DVT prophylaxis in the form of Xarelto.   OT was ordered. Patient had an uneventful night on the evening of surgery though was noted to have a critical hemoglobin lab and blood was administered. Her HGB has normalized.   They started to get up OOB with therapy on day one..the patient had progressed with therapy and meeting their goals.  Incision was healing well.  Patient was seen in rounds and was ready to go home.   Diet: Regular diet Activity:NWB LUE Follow-up:in 2 weeks Disposition - Home Discharged Condition: good   Discharge Instructions     Call MD / Call 911   Complete by: As directed    If you experience chest pain or shortness of breath, CALL 911 and be transported to the hospital emergency room.  If you develope a fever above 101 F, pus (white drainage) or increased drainage or redness at the wound, or calf pain, call your surgeon's office.   Constipation Prevention   Complete by: As directed    Drink plenty of fluids.  Prune juice may be helpful.  You may use a stool softener, such as Colace (over the counter) 100 mg twice a day.  Use MiraLax (over the counter) for constipation as needed.   Diet - low sodium heart healthy   Complete by: As directed    Increase activity slowly as tolerated   Complete by: As directed    Post-operative opioid taper instructions:   Complete by: As directed    POST-OPERATIVE OPIOID TAPER INSTRUCTIONS: It is important to wean off of your opioid medication as soon as possible. If you do not need pain medication after your surgery it is ok to stop day  one. Opioids include: Codeine, Hydrocodone(Norco, Vicodin), Oxycodone(Percocet, oxycontin) and hydromorphone amongst others.  Long term and even short term use  of opiods can cause: Increased pain response Dependence Constipation Depression Respiratory depression And more.  Withdrawal symptoms can include Flu like symptoms Nausea, vomiting And more Techniques to manage these symptoms Hydrate well Eat regular healthy meals Stay active Use relaxation techniques(deep breathing, meditating, yoga) Do Not substitute Alcohol to help with tapering If you have been on opioids for less than two weeks and do not have pain than it is ok to stop all together.  Plan to wean off of opioids This plan should start within one week post op of your joint replacement. Maintain the same interval or time between taking each dose and first decrease the dose.  Cut the total daily intake of opioids by one tablet each day Next start to increase the time between doses. The last dose that should be eliminated is the evening dose.         Allergies as of 08/04/2022       Reactions   Simvastatin Other (See Comments)   Muscle pain/knee pain.        Medication List     TAKE these medications    atorvastatin 40 MG tablet Commonly known as: LIPITOR Take 40 mg by mouth every evening.   benazepril-hydrochlorthiazide 20-12.5 MG tablet Commonly known as: LOTENSIN HCT Take 1 tablet by mouth every evening.   calcium carbonate 500 MG chewable tablet Commonly known as: TUMS - dosed in mg elemental calcium Chew 2 tablets by mouth daily as needed for indigestion or heartburn.   guaiFENesin 600 MG 12 hr tablet Commonly known as: MUCINEX Take 600 mg by mouth 2 (two) times daily as needed (congestion/cough).   HYDROcodone-acetaminophen 5-325 MG tablet Commonly known as: NORCO/VICODIN Take 1 tablet by mouth every 4 (four) hours as needed. What changed: when to take this   methocarbamol 500 MG  tablet Commonly known as: ROBAXIN Take 1 tablet (500 mg total) by mouth every 6 (six) hours as needed for muscle spasms.   omeprazole 20 MG capsule Commonly known as: PRILOSEC Take 20 mg by mouth 2 (two) times daily as needed (indigestion/heartburn.).   rivaroxaban 20 MG Tabs tablet Commonly known as: XARELTO Take 20 mg by mouth in the morning.   Rivaroxaban Stater Pack (15 mg and 20 mg) Commonly known as: XARELTO STARTER PACK Follow package directions: Take one 15mg  tablet by mouth twice a day. On day 22, switch to one 20mg  tablet once a day. Take with food.        Follow-up Information     Yolonda Kida, MD Follow up in 2 week(s).   Specialty: Orthopedic Surgery Contact information: [address] [place] [address] [phone]                 Signed: Dion Saucier PA-C 08/04/2022, 8:22 AM

## 2022-08-05 ENCOUNTER — Encounter (HOSPITAL_COMMUNITY): Payer: Self-pay | Admitting: Orthopedic Surgery

## 2022-08-09 ENCOUNTER — Other Ambulatory Visit: Payer: Self-pay | Admitting: Internal Medicine

## 2022-08-09 DIAGNOSIS — Z1231 Encounter for screening mammogram for malignant neoplasm of breast: Secondary | ICD-10-CM

## 2022-09-02 ENCOUNTER — Ambulatory Visit: Payer: BC Managed Care – PPO | Attending: Internal Medicine | Admitting: Interventional Cardiology

## 2022-09-02 VITALS — BP 174/64 | HR 77 | Ht 66.0 in | Wt 231.6 lb

## 2022-09-02 DIAGNOSIS — E782 Mixed hyperlipidemia: Secondary | ICD-10-CM

## 2022-09-02 DIAGNOSIS — G4733 Obstructive sleep apnea (adult) (pediatric): Secondary | ICD-10-CM

## 2022-09-02 DIAGNOSIS — I10 Essential (primary) hypertension: Secondary | ICD-10-CM | POA: Diagnosis not present

## 2022-09-02 DIAGNOSIS — Q248 Other specified congenital malformations of heart: Secondary | ICD-10-CM

## 2022-09-02 DIAGNOSIS — R0789 Other chest pain: Secondary | ICD-10-CM

## 2022-09-02 DIAGNOSIS — I2699 Other pulmonary embolism without acute cor pulmonale: Secondary | ICD-10-CM

## 2022-09-02 NOTE — Progress Notes (Signed)
Cardiology Office Note   Date:  09/02/2022   ID:  Renee Bennett, DOB March 10, 1956, MRN [id number]  PCP:  Ollen Bowl, MD    No chief complaint on file.  Atypical chest pain  Wt Readings from Last 3 Encounters:  09/02/22 231 lb 9.6 oz (105.1 kg)  08/02/22 230 lb (104.3 kg)  07/27/22 230 lb (104.3 kg)       History of Present Illness: Renee Bennett is a 67 y.o. female who saw Dr. Delton See in the past who is being seen today for the evaluation of atypical chest pain at the request of Bedsole, Amy E, MD. she has a history of obstructive sleep apnea, morbid obesity, hypertension and hyperlipidemia.   In 2015, she was seen by Dr. Delton See for acute pericarditis: "She was found to have significant diffuse PR depression and ST elevation suspicious of pericarditis. She was ruled out for ACS. She underwent an exercise nuclear stress test that showed no prior scar, no ischemia and preserved LVEF. Echocardiogram showed no pericardial effusion and she had no rub on physical exam. She was started on high-dose ibuprofen and discharged home. "  Had a PE and pericardial cyst diagnosed in 06/2022. CT showed:"Cardiovascular: Heart is enlarged. Mild atherosclerotic calcification is noted in the wall of the thoracic aorta. No large central pulmonary embolus evident. No filling defect in left-sided pulmonary arteries to suggest segmental or subsegmental pulmonary embolus. No evidence for pulmonary embolus in the middle or lower lobe distribution. Patient is noted to have a nonocclusive, thin linear filling defect in a segmental branch to the posterior right upper lobe (see axial image 102/series 11 and coronal image 103/series 15)."  She recently suffered a left ARM fracture and received a left humerus IM nail for fracture stabilization and healing.  Surgery was performed on August 02, 2022.    Past Medical History:  Diagnosis Date   Chest pain    Family history of adverse reaction to  anesthesia    sister slow to wake up   GERD (gastroesophageal reflux disease)    Hypercholesterolemia    Hypertension    Pneumonia    Pulmonary embolus (HCC)    Sleep apnea    uses cpap    Past Surgical History:  Procedure Laterality Date   ABDOMINAL HYSTERECTOMY     partial   COLONOSCOPY     ORIF HUMERUS FRACTURE Left 08/02/2022   Procedure: OPEN REDUCTION INTERNAL FIXATION (ORIF) PROXIMAL HUMERUS FRACTURE;  Surgeon: Yolonda Kida, MD;  Location: [facility] OR;  Service: Orthopedics;  Laterality: Left;  120   TONSILLECTOMY       Current Outpatient Medications  Medication Sig Dispense Refill   atorvastatin (LIPITOR) 40 MG tablet Take 40 mg by mouth every evening.     benazepril-hydrochlorthiazide (LOTENSIN HCT) 20-12.5 MG per tablet Take 1 tablet by mouth every evening.     calcium carbonate (TUMS - DOSED IN MG ELEMENTAL CALCIUM) 500 MG chewable tablet Chew 2 tablets by mouth daily as needed for indigestion or heartburn.     guaiFENesin (MUCINEX) 600 MG 12 hr tablet Take 600 mg by mouth 2 (two) times daily as needed (congestion/cough).     HYDROcodone-acetaminophen (NORCO/VICODIN) 5-325 MG tablet Take 1 tablet by mouth every 4 (four) hours as needed. 24 tablet 0   methocarbamol (ROBAXIN) 500 MG tablet Take 1 tablet (500 mg total) by mouth every 6 (six) hours as needed for muscle spasms. 30 tablet 0   omeprazole (PRILOSEC) 20 MG  capsule Take 20 mg by mouth 2 (two) times daily as needed (indigestion/heartburn.).     rivaroxaban (XARELTO) 20 MG TABS tablet Take 20 mg by mouth in the morning.     No current facility-administered medications for this visit.    Allergies:   Simvastatin    Social History:  The patient  reports that she has never smoked. She has been exposed to tobacco smoke. She has never used smokeless tobacco. She reports that she does not drink alcohol and does not use drugs.   Family History:  The patient's family history includes Breast cancer in her sister;  Diabetes in her father and maternal grandmother; Heart disease in her mother, paternal grandfather, and paternal grandmother; Hyperlipidemia in her father; Hypertension in her father, sister, and sister.    ROS:  Please see the history of present illness.   Otherwise, review of systems are positive for left arm swelling.   All other systems are reviewed and negative.    PHYSICAL EXAM: VS:  BP (!) 174/64   Pulse 77   Ht 5\' 6"  (1.676 m)   Wt 231 lb 9.6 oz (105.1 kg)   SpO2 95%   BMI 37.38 kg/m  , BMI Body mass index is 37.38 kg/m. GEN: Well nourished, well developed, in no acute distress HEENT: normal Neck: no JVD, carotid bruits, or masses Cardiac: RRR; no murmurs, rubs, or gallops,no edema  Respiratory:  clear to auscultation bilaterally, normal work of breathing GI: soft, nontender, nondistended, + BS MS: no deformity or atrophy; left arm swelling Skin: warm and dry, no rash Neuro:  Strength and sensation are intact Psych: euthymic mood, full affect   EKG:   The ekg ordered today demonstrates normal sinus rhythm, no ST segment changes   Recent Labs: 08/03/2022: BUN 12; Creatinine, Ser 0.57; Potassium 4.2; Sodium 138 08/04/2022: Hemoglobin 10.2; Platelets 280   Lipid Panel No results found for: "CHOL", "TRIG", "HDL", "CHOLHDL", "VLDL", "LDLCALC", "LDLDIRECT"   Other studies Reviewed: Additional studies/ records that were reviewed today with results demonstrating: ER records reviewed.   ASSESSMENT AND PLAN:  Chest pain: History of pericarditis back in 2015.  Negative ischemic workup in 2015.  No further chest pain at this time.  No plans for ischemic workup currently.  Regardless, she would not be candidate for any invasive testing at this time given that she is anticoagulated.  If symptoms return, could consider coronary CTA. Hypertension: Blood pressure well-controlled at other doctor visits.  Improved after resting in the office.  Hyperlipidemia: LDL 119 when she was not  taking atorvastatin. She returned to taking atorvastatin daily 1 month ago.  Check liver and lipid tests when fasting and in 6 weeks.  Given aortic atherosclerosis noted on CT scan, would like to see LDL at least less than 100. Morbid obesity:  Feels better after losing weight.   Obstructive sleep apnea: diagnosed in the past. Trying to lose weight.  Pericardial cyst: Check echo.  No signs of CHF on exam. Pulmonary embolus: On xarelto.  She is unaware of what the duration of Xarelto will be or whether any hypercoagulable workup will be done.   Current medicines are reviewed at length with the patient today.  The patient concerns regarding her medicines were addressed.  The following changes have been made:  No change  Labs/ tests ordered today include: Liver, lipids, echo No orders of the defined types were placed in this encounter.   Recommend 150 minutes/week of aerobic exercise Low fat, low carb,  high fiber diet recommended  Disposition:   FU in 1 year   Signed, Lance Muss, MD  09/02/2022 8:26 AM    [facility] [address] [place], [place], [place]  [address] Phone: [phone]; Fax: [phone]

## 2022-09-02 NOTE — Patient Instructions (Signed)
Medication Instructions:  Your physician recommends that you continue on your current medications as directed. Please refer to the Current Medication list given to you today.  *If you need a refill on your cardiac medications before your next appointment, please call your pharmacy*   Lab Work: Your physician recommends that you return for lab work in: about 6 weeks.  Lipid and liver profiles. This will be fasting  If you have labs (blood work) drawn today and your tests are completely normal, you will receive your results only by: MyChart Message (if you have MyChart) OR A paper copy in the mail If you have any lab test that is abnormal or we need to change your treatment, we will call you to review the results.   Testing/Procedures: Your physician has requested that you have an echocardiogram. Echocardiography is a painless test that uses sound waves to create images of your heart. It provides your doctor with information about the size and shape of your heart and how well your heart's chambers and valves are working. This procedure takes approximately one hour. There are no restrictions for this procedure. Please do NOT wear cologne, perfume, aftershave, or lotions (deodorant is allowed). Please arrive 15 minutes prior to your appointment time.    Follow-Up: At [facility], you and your health needs are our priority.  As part of our continuing mission to provide you with exceptional heart care, we have created designated Provider Care Teams.  These Care Teams include your primary Cardiologist (physician) and Advanced Practice Providers (APPs -  Physician Assistants and Nurse Practitioners) who all work together to provide you with the care you need, when you need it.  We recommend signing up for the patient portal called "MyChart".  Sign up information is provided on this After Visit Summary.  MyChart is used to connect with patients for Virtual Visits (Telemedicine).  Patients  are able to view lab/test results, encounter notes, upcoming appointments, etc.  Non-urgent messages can be sent to your provider as well.   To learn more about what you can do with MyChart, go to [email or web address].    Your next appointment:   12 month(s)  Provider:   Lance Muss, MD     Other Instructions

## 2022-09-20 ENCOUNTER — Ambulatory Visit
Admission: RE | Admit: 2022-09-20 | Discharge: 2022-09-20 | Disposition: A | Discharge status: Home / Self Care | Payer: BC Managed Care – PPO | Source: Ambulatory Visit | Attending: Internal Medicine | Admitting: Internal Medicine

## 2022-09-20 DIAGNOSIS — Z1231 Encounter for screening mammogram for malignant neoplasm of breast: Secondary | ICD-10-CM

## 2022-10-05 ENCOUNTER — Telehealth: Payer: Self-pay | Admitting: Interventional Cardiology

## 2022-10-05 ENCOUNTER — Ambulatory Visit (HOSPITAL_COMMUNITY): Payer: BC Managed Care – PPO | Attending: Interventional Cardiology

## 2022-10-05 DIAGNOSIS — I10 Essential (primary) hypertension: Secondary | ICD-10-CM | POA: Diagnosis present

## 2022-10-05 DIAGNOSIS — I2699 Other pulmonary embolism without acute cor pulmonale: Secondary | ICD-10-CM | POA: Insufficient documentation

## 2022-10-05 DIAGNOSIS — Q248 Other specified congenital malformations of heart: Secondary | ICD-10-CM | POA: Insufficient documentation

## 2022-10-05 DIAGNOSIS — R0789 Other chest pain: Secondary | ICD-10-CM | POA: Diagnosis present

## 2022-10-05 LAB — ECHOCARDIOGRAM COMPLETE
Area-P 1/2: 3.85 cm2
S' Lateral: 3.2 cm

## 2022-10-05 NOTE — Telephone Encounter (Signed)
Pt is requesting a letter by the provider giving permission to continue with therapy. [facility] - Renee Bennett is whom it needs to be sent to telephone # [phone]. Please contact patient if any further info is required.

## 2022-10-06 ENCOUNTER — Encounter: Payer: Self-pay | Admitting: *Deleted

## 2022-10-06 NOTE — Telephone Encounter (Signed)
No cardiac contraindication to physical therapy.

## 2022-10-06 NOTE — Telephone Encounter (Signed)
Patient notified.  She would like to pick up letter in office.  Will leave at front desk for her to pick up

## 2022-10-11 ENCOUNTER — Ambulatory Visit: Payer: BC Managed Care – PPO | Attending: Interventional Cardiology

## 2022-10-11 DIAGNOSIS — E782 Mixed hyperlipidemia: Secondary | ICD-10-CM

## 2022-10-11 LAB — HEPATIC FUNCTION PANEL
ALT: 16 IU/L (ref 0–32)
AST: 17 IU/L (ref 0–40)
Albumin: 4.4 g/dL (ref 3.9–4.9)
Alkaline Phosphatase: 119 IU/L (ref 44–121)
Bilirubin Total: 0.5 mg/dL (ref 0.0–1.2)
Bilirubin, Direct: 0.15 mg/dL (ref 0.00–0.40)
Total Protein: 6.8 g/dL (ref 6.0–8.5)

## 2022-10-11 LAB — LIPID PANEL
Chol/HDL Ratio: 3.4 ratio (ref 0.0–4.4)
Cholesterol, Total: 144 mg/dL (ref 100–199)
HDL: 42 mg/dL (ref >39)
LDL Chol Calc (NIH): 87 mg/dL (ref 0–99)
Triglycerides: 74 mg/dL (ref 0–149)
VLDL Cholesterol Cal: 15 mg/dL (ref 5–40)

## 2022-10-13 ENCOUNTER — Telehealth: Payer: Self-pay | Admitting: Interventional Cardiology

## 2022-10-13 NOTE — Telephone Encounter (Signed)
Patient is returning call in regards to results. Requesting return call.  

## 2022-10-13 NOTE — Telephone Encounter (Signed)
-----   Message from Corky Crafts, MD sent at 10/12/2022 12:54 PM EDT ----- LDL improved. Normal LFTs.

## 2022-10-13 NOTE — Telephone Encounter (Signed)
Patient notified

## 2023-03-09 ENCOUNTER — Other Ambulatory Visit: Payer: Self-pay | Admitting: Internal Medicine

## 2023-03-09 DIAGNOSIS — E2839 Other primary ovarian failure: Secondary | ICD-10-CM

## 2023-08-12 ENCOUNTER — Other Ambulatory Visit: Payer: Self-pay | Admitting: Internal Medicine

## 2023-08-12 DIAGNOSIS — Z1231 Encounter for screening mammogram for malignant neoplasm of breast: Secondary | ICD-10-CM

## 2023-09-09 ENCOUNTER — Encounter: Payer: Self-pay | Admitting: Cardiology

## 2023-09-09 ENCOUNTER — Ambulatory Visit: Payer: Self-pay | Attending: Cardiology | Admitting: Cardiology

## 2023-09-09 VITALS — BP 142/88 | HR 77 | Resp 16 | Ht 66.0 in | Wt 228.0 lb

## 2023-09-09 DIAGNOSIS — E782 Mixed hyperlipidemia: Secondary | ICD-10-CM

## 2023-09-09 DIAGNOSIS — I10 Essential (primary) hypertension: Secondary | ICD-10-CM

## 2023-09-09 DIAGNOSIS — I700 Atherosclerosis of aorta: Secondary | ICD-10-CM | POA: Diagnosis not present

## 2023-09-09 NOTE — Progress Notes (Signed)
 Cardiology Office Note:  .   Date:  09/09/2023  ID:  Renee Bennett, DOB December 14, 1955, MRN [id number] PCP: Elester Grim, MD  [facility] Providers Cardiologist:  Fransico Ivy, MD PCP: Elester Grim, MD  Chief Complaint  Patient presents with   Hypertension   Mixed hyperlipidemia   Follow-up     Renee Bennett is a 68 y.o. female with hypertension, hyperlipidemia, aortic atherosclerosis, h/o pericarditis, h/o PE (2024), OSA, obesity  History of Present Illness  Patient completed 6 months Xarelto  for PE, and has been on aspirin  80 mg daily since then.  She has not had any bleeding issues.  Blood pressure is elevated today.  She saw her PCP Dr. Lilyan Remedies yesterday, where hydrochlorothiazide  dose was increased.  She has 2-week follow-up with Dr. Annmarie Basket nurse for blood pressure check.  She had labs checked yesterday, report not available to me.      Vitals:   09/09/23 0847  BP: (!) 142/88  Pulse: 77  Resp: 16  SpO2: 95%      Review of Systems  Cardiovascular:  Negative for chest pain, dyspnea on exertion, leg swelling, palpitations and syncope.        Studies Reviewed: Aaron Aas       EKG 09/09/2023: Normal sinus rhythm Normal ECG When compared with ECG of 13-Jun-2022 03:11, No significant change was found    Independently interpreted 02/2023: Chol 173, TG 94, HDL 49, LDL 107 Hb 14.7 Cr 0.5 TSH 2.0  Echocardiogram 09/2022:  1. Left ventricular ejection fraction, by estimation, is 60 to 65%. The  left ventricle has normal function. The left ventricle has no regional  wall motion abnormalities. Left ventricular diastolic parameters are  consistent with Grade I diastolic  dysfunction (impaired relaxation).   2. Right ventricular systolic function is normal. The right ventricular  size is normal.   3. The mitral valve is normal in structure. Trivial mitral valve  regurgitation. No evidence of mitral stenosis.   4. The aortic valve is  tricuspid. Aortic valve regurgitation is not  visualized. No aortic stenosis is present.   5. The inferior vena cava is normal in size with greater than 50%  respiratory variability, suggesting right atrial pressure of 3 mmHg.    Physical Exam Vitals and nursing note reviewed.  Constitutional:      General: She is not in acute distress. Neck:     Vascular: No JVD.  Cardiovascular:     Rate and Rhythm: Normal rate and regular rhythm.     Heart sounds: Murmur heard.     Harsh midsystolic murmur is present with a grade of 1/6 at the upper right sternal border radiating to the neck.  Pulmonary:     Effort: Pulmonary effort is normal.     Breath sounds: Normal breath sounds. No wheezing or rales.  Musculoskeletal:     Right lower leg: Edema (Trace) present.     Left lower leg: Edema (Trace) present.      VISIT DIAGNOSES:   ICD-10-CM   1. Mixed hyperlipidemia  E78.2     2. Primary hypertension  I10 EKG 12-Lead    3. Aortic atherosclerosis (HCC)  I70.0        Renee Bennett is a 68 y.o. female with hypertension, hyperlipidemia, aortic atherosclerosis, h/o pericarditis, h/o PE (2024), OSA, obesity Assessment & Plan  Hypertension: Uncontrolled.  Hydrochlorothiazide  dose increased yesterday by PCP, has follow-up visit in 2 weeks. No change made today. Goal <130/80 mmHg.  If blood pressure remains uncontrolled, could consider adding amlodipine. Recommend regular walking, low-salt diet.  Aortic atherosclerosis, hyperlipidemia: In absence of bleeding, reasonable to continue aspirin  81 mg daily. LDL 107 in 02/2023. Lipid panel checked yesterday, results not available to me. Currently on Lipitor 40 mg daily. If LDL remains greater than 70, recommend increasing Lipitor to 80 mg daily. This can be followed up with her PCP Dr. Lilyan Remedies.  H/o pericarditis: No recurrence.    Continue close follow-up with PCP Dr. Tyler Gallant. I will see her as needed.  Signed, Cody Das, MD

## 2023-09-09 NOTE — Patient Instructions (Signed)
 Follow-Up: At [facility], you and your health needs are our priority.  As part of our continuing mission to provide you with exceptional heart care, our providers are all part of one team.  This team includes your primary Cardiologist (physician) and Advanced Practice Providers or APPs (Physician Assistants and Nurse Practitioners) who all work together to provide you with the care you need, when you need it.  Your next appointment:   As needed   Provider:   Cody Das, MD    We recommend signing up for the patient portal called "MyChart".  Sign up information is provided on this After Visit Summary.  MyChart is used to connect with patients for Virtual Visits (Telemedicine).  Patients are able to view lab/test results, encounter notes, upcoming appointments, etc.  Non-urgent messages can be sent to your provider as well.   To learn more about what you can do with MyChart, go to [email or web address].

## 2023-09-22 ENCOUNTER — Ambulatory Visit
Admission: RE | Admit: 2023-09-22 | Discharge: 2023-09-22 | Disposition: A | Discharge status: Home / Self Care | Payer: Self-pay | Source: Ambulatory Visit | Attending: Internal Medicine | Admitting: Internal Medicine

## 2023-09-22 DIAGNOSIS — Z1231 Encounter for screening mammogram for malignant neoplasm of breast: Secondary | ICD-10-CM

## 2023-09-29 ENCOUNTER — Other Ambulatory Visit: Payer: Self-pay | Admitting: Internal Medicine

## 2023-09-29 DIAGNOSIS — Z13820 Encounter for screening for osteoporosis: Secondary | ICD-10-CM

## 2023-09-29 DIAGNOSIS — E2839 Other primary ovarian failure: Secondary | ICD-10-CM

## 2023-11-02 ENCOUNTER — Other Ambulatory Visit: Payer: BC Managed Care – PPO

## 2023-11-10 ENCOUNTER — Ambulatory Visit
Admission: RE | Admit: 2023-11-10 | Discharge: 2023-11-10 | Disposition: A | Discharge status: Home / Self Care | Source: Ambulatory Visit | Attending: Internal Medicine | Admitting: Internal Medicine

## 2023-11-10 DIAGNOSIS — E2839 Other primary ovarian failure: Secondary | ICD-10-CM | POA: Diagnosis present

## 2023-11-10 DIAGNOSIS — Z13820 Encounter for screening for osteoporosis: Secondary | ICD-10-CM | POA: Insufficient documentation
